# Patient Record
Sex: Male | Born: 1955 | Race: White | Hispanic: No | Marital: Married | Smoking: Never smoker
Health system: Southern US, Community
[De-identification: ages and names within clinical notes are randomized; demographics above are authoritative.]

## PROBLEM LIST (undated history)

## (undated) DIAGNOSIS — N419 Inflammatory disease of prostate, unspecified: Secondary | ICD-10-CM

## (undated) DIAGNOSIS — E785 Hyperlipidemia, unspecified: Secondary | ICD-10-CM

## (undated) DIAGNOSIS — I10 Essential (primary) hypertension: Secondary | ICD-10-CM

## (undated) DIAGNOSIS — K219 Gastro-esophageal reflux disease without esophagitis: Secondary | ICD-10-CM

## (undated) DIAGNOSIS — N400 Enlarged prostate without lower urinary tract symptoms: Secondary | ICD-10-CM

## (undated) HISTORY — PX: COLONOSCOPY: SHX174

## (undated) HISTORY — DX: Hyperlipidemia, unspecified: E78.5

## (undated) HISTORY — DX: Essential (primary) hypertension: I10

## (undated) HISTORY — DX: Gastro-esophageal reflux disease without esophagitis: K21.9

## (undated) HISTORY — DX: Benign prostatic hyperplasia without lower urinary tract symptoms: N40.0

## (undated) HISTORY — DX: Inflammatory disease of prostate, unspecified: N41.9

---

## 1996-07-11 HISTORY — PX: APPENDECTOMY: SHX54

## 1997-12-26 ENCOUNTER — Other Ambulatory Visit: Admission: RE | Admit: 1997-12-26 | Discharge: 1997-12-26 | Payer: Self-pay | Admitting: Podiatry

## 2001-03-09 ENCOUNTER — Emergency Department (HOSPITAL_COMMUNITY): Admission: EM | Admit: 2001-03-09 | Discharge: 2001-03-09 | Payer: Self-pay | Admitting: Emergency Medicine

## 2001-06-14 ENCOUNTER — Emergency Department (HOSPITAL_COMMUNITY): Admission: EM | Admit: 2001-06-14 | Discharge: 2001-06-14 | Payer: Self-pay

## 2001-06-14 ENCOUNTER — Encounter: Payer: Self-pay | Admitting: Emergency Medicine

## 2005-03-07 ENCOUNTER — Ambulatory Visit: Payer: Self-pay | Admitting: Internal Medicine

## 2005-05-10 ENCOUNTER — Ambulatory Visit: Payer: Self-pay | Admitting: Internal Medicine

## 2005-05-30 ENCOUNTER — Ambulatory Visit: Payer: Self-pay | Admitting: Internal Medicine

## 2005-06-10 ENCOUNTER — Ambulatory Visit: Payer: Self-pay | Admitting: Internal Medicine

## 2005-07-11 HISTORY — PX: ROTATOR CUFF REPAIR: SHX139

## 2005-09-30 ENCOUNTER — Ambulatory Visit (HOSPITAL_BASED_OUTPATIENT_CLINIC_OR_DEPARTMENT_OTHER): Admission: RE | Admit: 2005-09-30 | Discharge: 2005-09-30 | Payer: Self-pay | Admitting: Orthopedic Surgery

## 2007-05-08 ENCOUNTER — Encounter: Payer: Self-pay | Admitting: Internal Medicine

## 2007-07-03 ENCOUNTER — Ambulatory Visit: Payer: Self-pay | Admitting: Internal Medicine

## 2007-07-03 DIAGNOSIS — R002 Palpitations: Secondary | ICD-10-CM | POA: Insufficient documentation

## 2007-07-03 DIAGNOSIS — K219 Gastro-esophageal reflux disease without esophagitis: Secondary | ICD-10-CM | POA: Insufficient documentation

## 2007-07-03 DIAGNOSIS — R1011 Right upper quadrant pain: Secondary | ICD-10-CM | POA: Insufficient documentation

## 2007-07-03 LAB — CONVERTED CEMR LAB: Vit D, 1,25-Dihydroxy: 36 (ref 30–89)

## 2007-07-04 ENCOUNTER — Encounter: Payer: Self-pay | Admitting: Internal Medicine

## 2007-07-04 LAB — CONVERTED CEMR LAB
Basophils Absolute: 0 10*3/uL (ref 0.0–0.1)
Basophils Relative: 0.6 % (ref 0.0–1.0)
Eosinophils Absolute: 0.1 10*3/uL (ref 0.0–0.6)
Eosinophils Relative: 1.9 % (ref 0.0–5.0)
H Pylori IgG: POSITIVE — AB
HCT: 49.7 % (ref 39.0–52.0)
Hemoglobin: 17.3 g/dL — ABNORMAL HIGH (ref 13.0–17.0)
Lymphocytes Relative: 33.3 % (ref 12.0–46.0)
MCHC: 34.8 g/dL (ref 30.0–36.0)
MCV: 90.1 fL (ref 78.0–100.0)
Monocytes Absolute: 0.4 10*3/uL (ref 0.2–0.7)
Monocytes Relative: 7.1 % (ref 3.0–11.0)
Neutro Abs: 3.3 10*3/uL (ref 1.4–7.7)
Neutrophils Relative %: 57.1 % (ref 43.0–77.0)
Platelets: 208 10*3/uL (ref 150–400)
RBC: 5.51 M/uL (ref 4.22–5.81)
RDW: 12.3 % (ref 11.5–14.6)
TSH: 0.87 microintl units/mL (ref 0.35–5.50)
WBC: 5.7 10*3/uL (ref 4.5–10.5)

## 2007-07-06 ENCOUNTER — Encounter: Payer: Self-pay | Admitting: Internal Medicine

## 2007-07-06 ENCOUNTER — Encounter: Admission: RE | Admit: 2007-07-06 | Discharge: 2007-07-06 | Payer: Self-pay | Admitting: Internal Medicine

## 2007-07-10 ENCOUNTER — Ambulatory Visit: Payer: Self-pay | Admitting: Internal Medicine

## 2007-07-18 LAB — CONVERTED CEMR LAB
ALT: 19 units/L (ref 0–53)
Bilirubin, Direct: 0.3 mg/dL (ref 0.0–0.3)
CO2: 31 meq/L (ref 19–32)
Calcium: 9.3 mg/dL (ref 8.4–10.5)
Cholesterol: 139 mg/dL (ref 0–200)
GFR calc Af Amer: 92 mL/min
Glucose, Bld: 90 mg/dL (ref 70–99)
HDL: 33.1 mg/dL — ABNORMAL LOW (ref >39.0)
Hemoglobin, Urine: NEGATIVE
Ketones, ur: NEGATIVE mg/dL
LDL Cholesterol: 93 mg/dL (ref 0–99)
Potassium: 3.9 meq/L (ref 3.5–5.1)
Total Bilirubin: 1.5 mg/dL — ABNORMAL HIGH (ref 0.3–1.2)
Total CHOL/HDL Ratio: 4.2
Total Protein: 6.9 g/dL (ref 6.0–8.3)
Urine Glucose: NEGATIVE mg/dL

## 2007-07-27 ENCOUNTER — Encounter: Admission: RE | Admit: 2007-07-27 | Discharge: 2007-07-27 | Payer: Self-pay | Admitting: Orthopedic Surgery

## 2008-05-19 ENCOUNTER — Encounter: Payer: Self-pay | Admitting: Internal Medicine

## 2008-11-12 ENCOUNTER — Ambulatory Visit: Payer: Self-pay | Admitting: Internal Medicine

## 2008-11-12 LAB — CONVERTED CEMR LAB
Albumin: 4 g/dL (ref 3.5–5.2)
Basophils Absolute: 0.1 10*3/uL (ref 0.0–0.1)
Basophils Relative: 1.5 % (ref 0.0–3.0)
Chloride: 109 meq/L (ref 96–112)
Cholesterol: 132 mg/dL (ref 0–200)
Creatinine, Ser: 1.1 mg/dL (ref 0.4–1.5)
Eosinophils Absolute: 0.1 10*3/uL (ref 0.0–0.7)
GFR calc non Af Amer: 75.07 mL/min (ref >60)
HCT: 46.6 % (ref 39.0–52.0)
Hemoglobin, Urine: NEGATIVE
Hemoglobin: 16.4 g/dL (ref 13.0–17.0)
LDL Cholesterol: 91 mg/dL (ref 0–99)
Leukocytes, UA: NEGATIVE
Lymphocytes Relative: 33.5 % (ref 12.0–46.0)
Lymphs Abs: 1.5 10*3/uL (ref 0.7–4.0)
MCHC: 35.2 g/dL (ref 30.0–36.0)
MCV: 86.8 fL (ref 78.0–100.0)
Monocytes Absolute: 0.5 10*3/uL (ref 0.1–1.0)
Neutro Abs: 2.4 10*3/uL (ref 1.4–7.7)
PSA: 3.66 ng/mL (ref 0.10–4.00)
RDW: 12.8 % (ref 11.5–14.6)
Specific Gravity, Urine: 1.015 (ref 1.000–1.030)
TSH: 0.74 microintl units/mL (ref 0.35–5.50)
Total Protein: 7 g/dL (ref 6.0–8.3)
Triglycerides: 67 mg/dL (ref 0.0–149.0)
Urobilinogen, UA: 0.2 (ref 0.0–1.0)

## 2008-11-13 ENCOUNTER — Ambulatory Visit: Payer: Self-pay | Admitting: Internal Medicine

## 2008-11-13 DIAGNOSIS — R972 Elevated prostate specific antigen [PSA]: Secondary | ICD-10-CM | POA: Insufficient documentation

## 2008-11-24 ENCOUNTER — Encounter: Admission: RE | Admit: 2008-11-24 | Discharge: 2008-11-24 | Payer: Self-pay | Admitting: Orthopedic Surgery

## 2008-12-24 ENCOUNTER — Ambulatory Visit: Payer: Self-pay | Admitting: Gastroenterology

## 2009-01-14 ENCOUNTER — Ambulatory Visit: Payer: Self-pay | Admitting: Gastroenterology

## 2009-01-14 LAB — HM COLONOSCOPY

## 2009-11-19 ENCOUNTER — Ambulatory Visit: Payer: Self-pay | Admitting: Internal Medicine

## 2009-11-24 ENCOUNTER — Encounter: Payer: Self-pay | Admitting: Internal Medicine

## 2009-11-24 LAB — CONVERTED CEMR LAB
AST: 22 units/L (ref 0–37)
Alkaline Phosphatase: 69 units/L (ref 39–117)
Basophils Absolute: 0 10*3/uL (ref 0.0–0.1)
Bilirubin Urine: NEGATIVE
Bilirubin, Direct: 0.2 mg/dL (ref 0.0–0.3)
CO2: 30 meq/L (ref 19–32)
Calcium: 9.2 mg/dL (ref 8.4–10.5)
Creatinine, Ser: 1 mg/dL (ref 0.4–1.5)
Eosinophils Absolute: 0.1 10*3/uL (ref 0.0–0.7)
GFR calc non Af Amer: 83.47 mL/min (ref >60)
Glucose, Bld: 87 mg/dL (ref 70–99)
HDL: 37.8 mg/dL — ABNORMAL LOW (ref >39.00)
Hemoglobin, Urine: NEGATIVE
Hemoglobin: 16.8 g/dL (ref 13.0–17.0)
Ketones, ur: NEGATIVE mg/dL
LDL Cholesterol: 94 mg/dL (ref 0–99)
Lymphocytes Relative: 33.7 % (ref 12.0–46.0)
MCHC: 34.4 g/dL (ref 30.0–36.0)
Monocytes Relative: 9.4 % (ref 3.0–12.0)
Neutro Abs: 3.6 10*3/uL (ref 1.4–7.7)
Neutrophils Relative %: 54.6 % (ref 43.0–77.0)
PSA: 4.51 ng/mL — ABNORMAL HIGH (ref 0.10–4.00)
RBC: 5.48 M/uL (ref 4.22–5.81)
RDW: 13 % (ref 11.5–14.6)
Sodium: 143 meq/L (ref 135–145)
Total CHOL/HDL Ratio: 4
Triglycerides: 118 mg/dL (ref 0.0–149.0)
Urine Glucose: NEGATIVE mg/dL
Urobilinogen, UA: 0.2 (ref 0.0–1.0)
VLDL: 23.6 mg/dL (ref 0.0–40.0)

## 2009-11-25 ENCOUNTER — Ambulatory Visit: Payer: Self-pay | Admitting: Internal Medicine

## 2009-11-25 DIAGNOSIS — M25519 Pain in unspecified shoulder: Secondary | ICD-10-CM | POA: Insufficient documentation

## 2009-11-25 DIAGNOSIS — N410 Acute prostatitis: Secondary | ICD-10-CM | POA: Insufficient documentation

## 2010-02-24 ENCOUNTER — Ambulatory Visit: Payer: Self-pay | Admitting: Internal Medicine

## 2010-02-24 LAB — CONVERTED CEMR LAB
Hemoglobin, Urine: NEGATIVE
Nitrite: NEGATIVE
Specific Gravity, Urine: 1.025 (ref 1.000–1.030)
Total Protein, Urine: NEGATIVE mg/dL
pH: 6 (ref 5.0–8.0)

## 2010-03-01 ENCOUNTER — Telehealth: Payer: Self-pay | Admitting: Internal Medicine

## 2010-03-01 LAB — CONVERTED CEMR LAB
PSA, Free: 0.5 ng/mL
PSA: 3.11 ng/mL (ref 0.10–4.00)

## 2010-03-05 ENCOUNTER — Encounter: Payer: Self-pay | Admitting: Internal Medicine

## 2010-06-08 ENCOUNTER — Encounter: Payer: Self-pay | Admitting: Internal Medicine

## 2010-08-10 NOTE — Assessment & Plan Note (Signed)
Summary: cpx-lb   Vital Signs:  Patient profile:   55 year old male Height:      73 inches Weight:      258.13 pounds BMI:     34.18 O2 Sat:      95 % on Room air Temp:     98.2 degrees F oral Pulse rate:   71 / minute BP sitting:   160 / 110  (left arm) Cuff size:   large  Vitals Entered By: Lucious Groves (Nov 25, 2009 10:38 AM)  O2 Flow:  Room air CC: CPX./kb Is Patient Diabetic? No Pain Assessment Patient in pain? no        CC:  CPX./kb.  History of Present Illness: The patient presents for a wellness examination  C/o R shoulder pain - MRI is pending   Current Medications (verified): 1)  Vitamin D3 1000 Unit  Tabs (Cholecalciferol) .Marland Kitchen.. 1 Qd 2)  Protonix 40 Mg  Tbec (Pantoprazole Sodium) .... Once Daily 3)  Propranolol Hcl 10 Mg Tabs (Propranolol Hcl) .Marland Kitchen.. 1 By Mouth Two Times A Day As Needed Palpitation  Allergies (verified): No Known Drug Allergies  Past History:  Past Surgical History: Last updated: 07/03/2007 Rotator cuff repair R 2007  Family History: Last updated: 07/03/2007 No CAD F  brain aneurism  Social History: Last updated: 07/03/2007 Occupation: Married Never Smoked  Past Medical History: GERD Prostatitis  Family History: Reviewed history from 07/03/2007 and no changes required. No CAD F  brain aneurism  Social History: Reviewed history from 07/03/2007 and no changes required. Occupation: Married Never Smoked  Review of Systems  The patient denies anorexia, fever, weight loss, weight gain, vision loss, decreased hearing, hoarseness, chest pain, syncope, dyspnea on exertion, peripheral edema, prolonged cough, headaches, hemoptysis, abdominal pain, melena, hematochezia, severe indigestion/heartburn, hematuria, incontinence, genital sores, muscle weakness, suspicious skin lesions, transient blindness, difficulty walking, depression, unusual weight change, abnormal bleeding, enlarged lymph nodes, angioedema, and testicular masses.      Physical Exam  General:  Well-developed,well-nourished,in no acute distress; alert,appropriate and cooperative throughout examination Head:  Normocephalic and atraumatic without obvious abnormalities. No apparent alopecia or balding. Eyes:  No corneal or conjunctival inflammation noted. EOMI. Perrla Ears:  External ear exam shows no significant lesions or deformities.  Otoscopic examination reveals clear canals, tympanic membranes are intact bilaterally without bulging, retraction, inflammation or discharge. Hearing is grossly normal bilaterally. Nose:  External nasal examination shows no deformity or inflammation. Nasal mucosa are pink and moist without lesions or exudates. Mouth:  Oral mucosa and oropharynx without lesions or exudates.  Teeth in good repair. Neck:  No deformities, masses, or tenderness noted. Lungs:  Normal respiratory effort, chest expands symmetrically. Lungs are clear to auscultation, no crackles or wheezes. Heart:  Normal rate and regular rhythm. S1 and S2 normal without gallop, murmur, click, rub or other extra sounds. Abdomen:  Bowel sounds positive,abdomen soft and non-tender without masses, organomegaly or hernias noted. Rectal:  No external abnormalities noted. Normal sphincter tone. No rectal masses or tenderness. Genitalia:  Testes bilaterally descended without nodularity, tenderness or masses. No scrotal masses or lesions. No penis lesions or urethral discharge. Prostate:  NT, no gland enlargement and no nodules.   Msk:  No deformity or scoliosis noted of thoracic or lumbar spine.   Extremities:  No clubbing, cyanosis, edema, or deformity noted with normal full range of motion of all joints.   Neurologic:  No cranial nerve deficits noted. Station and gait are normal. Plantar reflexes are down-going bilaterally.  DTRs are symmetrical throughout. Sensory, motor and coordinative functions appear intact. Skin:  Intact without suspicious lesions or rashes Cervical  Nodes:  No lymphadenopathy noted Inguinal Nodes:  No significant adenopathy Psych:  Nl   Impression & Recommendations:  Problem # 1:  PHYSICAL EXAMINATION (ICD-V70.0) Assessment New Health and age related issues were discussed. Available screening tests and vaccinations were discussed as well. Healthy life style including good diet and execise was discussed. He had a colonoscopy. The labs were reviewed with the patient.  Orders: EKG w/ Interpretation (93000)  Problem # 2:  SHOULDER PAIN (ICD-719.41) R Assessment: Deteriorated Ortho f/u; MRI is pending  His updated medication list for this problem includes:    Tramadol Hcl 50 Mg Tabs (Tramadol hcl) .Marland Kitchen... 1-2 tabs by mouth two times a day as needed pain  Problem # 3:  PROSTATITIS, ACUTE (ICD-601.0) Assessment: New Cipro Recheck labs in 3 months   Problem # 4:  PROSTATE SPECIFIC ANTIGEN, ELEVATED (ICD-790.93) Assessment: Comment Only Recheck after treatment of #3; free PSA  Complete Medication List: 1)  Vitamin D3 1000 Unit Tabs (Cholecalciferol) .Marland Kitchen.. 1 qd 2)  Protonix 40 Mg Tbec (Pantoprazole sodium) .... Once daily 3)  Propranolol Hcl 10 Mg Tabs (Propranolol hcl) .Marland Kitchen.. 1 by mouth two times a day as needed palpitation 4)  Ciprofloxacin Hcl 500 Mg Tabs (Ciprofloxacin hcl) .Marland Kitchen.. 1 by mouth bid 5)  Tramadol Hcl 50 Mg Tabs (Tramadol hcl) .Marland Kitchen.. 1-2 tabs by mouth two times a day as needed pain  Other Orders: Tdap => 64yrs IM ([address]) Admin 1st Vaccine (60454) Admin 1st Vaccine (State) [phone])  Patient Instructions: 1)  In 3 months:  2)  PSA prior to visit, ICD-9: 599.0 3)  Free PSA 4)  Urine-dip prior to visit, ICD-9: Prescriptions: TRAMADOL HCL 50 MG TABS (TRAMADOL HCL) 1-2 tabs by mouth two times a day as needed pain  #120 x 3   Entered and Authorized by:   Tresa Garter MD   Signed by:   Tresa Garter MD on 11/25/2009   Method used:   Electronically to        [facility]  [phone]* (retail)       [address] [place], [place]  [address]       Ph: [phone] or [phone]       Fax: [phone]   RxID:   [id number] CIPROFLOXACIN HCL 500 MG TABS (CIPROFLOXACIN HCL) 1 by mouth bid  #28 x 1   Entered and Authorized by:   Tresa Garter MD   Signed by:   Tresa Garter MD on 11/25/2009   Method used:   Electronically to        [facility]  [phone]* (retail)       [address] [place], [place]  [address]       Ph: [phone] or [phone]       Fax: [phone]   RxID:   [id number]    Tetanus/Td Vaccine    Vaccine Type: Tdap    Site: left deltoid    Mfr: GlaxoSmithKline    Dose: 0.5 ml    Route: IM    Given by: Lucious Groves    Exp. Date: 10/03/2011    Lot #: [id number]    VIS given: 05/29/07 version given Nov 25, 2009.

## 2010-08-10 NOTE — Progress Notes (Signed)
Summary: Urology Referral  Phone Note Call from Patient   Summary of Call: pt informed of free PSA results. He is fine w. Urology consult.  He prefers Tue-Thur anytime.  Please enter referral info, Initial call taken by: Lanier Prude, [facility]),  March 01, 2010 4:24 PM  Follow-up for Phone Call        ok Follow-up by: Tresa Garter MD,  March 01, 2010 5:36 PM  Additional Follow-up for Phone Call Additional follow up Details #1::        Closed phone note. Additional Follow-up by: Lucious Groves CMA,  March 02, 2010 8:59 AM

## 2010-08-10 NOTE — Letter (Signed)
Summary: [facility]  [facility]   Imported By: Lennie Odor 06/15/2010 11:25:07  _____________________________________________________________________  External Attachment:    Type:   Image     Comment:   External Document

## 2010-08-10 NOTE — Letter (Signed)
Summary: Generic Letter  [facility]  [address] [place], [place] [address]   Phone: [phone]  Fax: [phone]    11/24/2009  Randy Knapp [address] [place], [place]  [address]  Dear [name],  Our office has not been successful in reaching you in regards to your test results. Please contact our office to correct your contact information and discuss lab results.       Sincerely,   Jacinta Shoe, MD

## 2010-08-13 NOTE — Letter (Signed)
Summary: [facility]   Imported By: Sherian Rein 03/16/2010 07:54:37  _____________________________________________________________________  External Attachment:    Type:   Image     Comment:   External Document

## 2010-11-26 NOTE — Op Note (Signed)
NAMEENMANUEL, ZUFALL NO.:  [id number]   MEDICAL RECORD NO.:  [id number]          PATIENT TYPE:  AMB   LOCATION:  [facility]                          FACILITY:  [facility]   PHYSICIAN:  Harvie Junior, M.D.   DATE OF BIRTH:  [birth year]   DATE OF PROCEDURE:  09/30/2005  DATE OF DISCHARGE:  09/30/2005                                 OPERATIVE REPORT   SURGEON:  Harvie Junior, M.D.   ASSISTANT:  Marshia Ly, P.A.   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   POSTOPERATIVE DIAGNOSES:  Impingement, acromioclavicular joint arthritis,  with a questionable rotator cuff tear, chondral injury to the humeral head.   PRINCIPLE PROCEDURES:  1. Anterolateral acromioplasty from the lateral and posterior compartment.  2. Distal clavicle resection through an anterior compartment.  3. Debridement of high-grade partial-thickness rotator cuff tear from the      undersurface, but within the glenohumeral joint.  4. Debridement of chondral injury to the humeral head.   NOTE:  The date of the procedure was 09/30/2005, the date of repeat lost  dictation is going to be 02/17/2006.  The original dictation date would have  been 09/30/2005.   ANESTHESIA:  General.   BRIEF HISTORY:  Mr. 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  He is a gentleman, who has had a long history of having  significant shoulder pain.  We treated him with anti-inflammatory  medication, activity modification, stretching and strengthening program,  physical therapy, injection therapy; all of this failed.  And because of  continuing complaints of left shoulder pain, the patient was ultimately  taken to the operating room for operative shoulder arthroscopy.   PROCEDURE:  Patient was taken to the operating room, where after adequate  anesthesia was obtained with general anesthesia, the patient was placed  supine on the  operating table.  The left shoulder was then prepped and  draped in the usual sterile fashion.  Following this, routine arthroscopic  examination of the shoulder revealed that there was an obvious significant  undersurface rotator cuff tear.  This was debrided from within the  glenohumeral joint.  The patient was also shown to have some grade-2 and  grade-3 changes on the humeral head, and these were debrided from within the  glenohumeral joint with the suction shaver back to a smooth and stable rim.  A check was made for any loose and fragmented pieces.  Seeing none, the  glenohumeral joint was copiously irrigated and suctioned dried.  The cannula  was then moved out of the glenohumeral joint into the subacromial space.  The area of the rotator cuff had been marked previously on the undersurface  so that it could be evaluated on the superior surface.  Once this was  accomplished, attention was turned to the subacromial space, and the area of  the rotator cuff was identified.  It did not appear to be full thickness at  this point; in fact, it felt to have a very, somewhat normal feel to the  rotator cuff in  this area.  At this point, a subtotal bursectomy was  performed in the subacromial space, and then an anterolateral acromioplasty  from the lateral and posterior compartment was performed.  Once this was  completed, attention was turned to the distal clavicle, where distal  clavicle resection of 17 mm was performed through an anterior portal.  At  this point, the rotator cuff was again evaluated thoroughly and probed.  The  suction shaver was used to make sure there was no significant continued  bursal inflammation, and once this was accomplished, the patient had his  shoulder copiously irrigated and suctioned dry.  The arthroscopic portals  were closed with a bandage.  A sterile compressive dressing was applied.  The patient was taken to the recovery room, where she was noted to be in   satisfactory condition.  The estimated blood loss for the procedure was  negligible.      Harvie Junior, M.D.  Electronically Signed     JLG/MEDQ  D:  02/17/2006  T:  02/17/2006  Job:  [id number]

## 2011-01-18 ENCOUNTER — Other Ambulatory Visit (INDEPENDENT_AMBULATORY_CARE_PROVIDER_SITE_OTHER): Payer: Self-pay

## 2011-01-18 ENCOUNTER — Encounter: Payer: Self-pay | Admitting: Internal Medicine

## 2011-01-18 ENCOUNTER — Other Ambulatory Visit: Payer: Self-pay | Admitting: Internal Medicine

## 2011-01-18 DIAGNOSIS — Z0000 Encounter for general adult medical examination without abnormal findings: Secondary | ICD-10-CM

## 2011-01-18 DIAGNOSIS — Z0389 Encounter for observation for other suspected diseases and conditions ruled out: Secondary | ICD-10-CM

## 2011-01-18 LAB — LIPID PANEL
HDL: 38.6 mg/dL — ABNORMAL LOW (ref >39.00)
LDL Cholesterol: 94 mg/dL (ref 0–99)
Total CHOL/HDL Ratio: 4
Triglycerides: 98 mg/dL (ref 0.0–149.0)
VLDL: 19.6 mg/dL (ref 0.0–40.0)

## 2011-01-18 LAB — HEPATIC FUNCTION PANEL
Albumin: 4.2 g/dL (ref 3.5–5.2)
Alkaline Phosphatase: 69 U/L (ref 39–117)
Total Protein: 6.8 g/dL (ref 6.0–8.3)

## 2011-01-18 LAB — CBC WITH DIFFERENTIAL/PLATELET
Lymphocytes Relative: 39.4 % (ref 12.0–46.0)
MCHC: 34.1 g/dL (ref 30.0–36.0)
Monocytes Absolute: 0.5 10*3/uL (ref 0.1–1.0)
Neutrophils Relative %: 48 % (ref 43.0–77.0)
RBC: 5.46 Mil/uL (ref 4.22–5.81)
RDW: 14.1 % (ref 11.5–14.6)

## 2011-01-18 LAB — BASIC METABOLIC PANEL
CO2: 28 mEq/L (ref 19–32)
Calcium: 9.2 mg/dL (ref 8.4–10.5)
Chloride: 110 mEq/L (ref 96–112)
Potassium: 4.1 mEq/L (ref 3.5–5.1)
Sodium: 144 mEq/L (ref 135–145)

## 2011-01-18 LAB — URINALYSIS
Bilirubin Urine: NEGATIVE
Ketones, ur: NEGATIVE
Leukocytes, UA: NEGATIVE
pH: 6 (ref 5.0–8.0)

## 2011-01-20 ENCOUNTER — Encounter: Payer: Self-pay | Admitting: Internal Medicine

## 2011-01-25 ENCOUNTER — Encounter: Payer: Self-pay | Admitting: Internal Medicine

## 2011-01-25 ENCOUNTER — Ambulatory Visit (INDEPENDENT_AMBULATORY_CARE_PROVIDER_SITE_OTHER): Payer: BC Managed Care – PPO | Admitting: Internal Medicine

## 2011-01-25 VITALS — BP 130/88 | HR 76 | Temp 98.4°F | Resp 16 | Ht 73.0 in | Wt 269.0 lb

## 2011-01-25 DIAGNOSIS — Z0000 Encounter for general adult medical examination without abnormal findings: Secondary | ICD-10-CM

## 2011-01-25 MED ORDER — NAPROXEN-ESOMEPRAZOLE 500-20 MG PO TBEC: 1.0000 | DELAYED_RELEASE_TABLET | Freq: Two times a day (BID) | ORAL | Status: DC

## 2011-01-25 MED ORDER — KETOCONAZOLE 2 % EX CREA: TOPICAL_CREAM | Freq: Two times a day (BID) | CUTANEOUS | Status: AC

## 2011-01-25 NOTE — Progress Notes (Signed)
  Subjective:    Patient ID: Randy Knapp. Randy Knapp, male    DOB: 1956-03-01, 55 y.o.   MRN: [id number]  HPI  The patient is here for a wellness exam. The patient has been doing well overall without major physical or psychological issues going on lately.  Review of Systems  Constitutional: Negative for appetite change, fatigue and unexpected weight change.  HENT: Negative for nosebleeds, congestion, sore throat, sneezing, trouble swallowing and neck pain.   Eyes: Negative for itching and visual disturbance.  Respiratory: Negative for cough.   Cardiovascular: Negative for chest pain, palpitations and leg swelling.  Gastrointestinal: Negative for nausea, diarrhea, blood in stool and abdominal distention.  Genitourinary: Negative for frequency and hematuria.  Musculoskeletal: Negative for back pain, joint swelling and gait problem.  Skin: Negative for rash.  Neurological: Negative for dizziness, tremors, speech difficulty and weakness.  Psychiatric/Behavioral: Negative for sleep disturbance, dysphoric mood and agitation. The patient is not nervous/anxious.        Objective:   Physical Exam  Constitutional: He is oriented to person, place, and time. He appears well-developed.  HENT:  Mouth/Throat: Oropharynx is clear and moist.  Eyes: Conjunctivae are normal. Pupils are equal, round, and reactive to light.  Neck: Normal range of motion. No JVD present. No thyromegaly present.  Cardiovascular: Normal rate, regular rhythm, normal heart sounds and intact distal pulses.  Exam reveals no gallop and no friction rub.   No murmur heard. Pulmonary/Chest: Effort normal and breath sounds normal. No respiratory distress. He has no wheezes. He has no rales. He exhibits no tenderness.  Abdominal: Soft. Bowel sounds are normal. He exhibits no distension and no mass. There is no tenderness. There is no rebound and no guarding.  Musculoskeletal: Normal range of motion. He exhibits no edema and no tenderness.    Lymphadenopathy:    He has no cervical adenopathy.  Neurological: He is alert and oriented to person, place, and time. He has normal reflexes. No cranial nerve deficit. He exhibits normal muscle tone. Coordination normal.  Skin: Skin is warm and dry. Rash (axillas) noted.       Moles  Psychiatric: He has a normal mood and affect. His behavior is normal. Judgment and thought content normal.  rash under arms Moles      Lab Results  Component Value Date   WBC 5.5 01/18/2011   HGB 15.7 01/18/2011   HCT 46.2 01/18/2011   PLT 182.0 01/18/2011   CHOL 152 01/18/2011   TRIG 98.0 01/18/2011   HDL 38.60* 01/18/2011   ALT 23 01/18/2011   AST 21 01/18/2011   NA 144 01/18/2011   K 4.1 01/18/2011   CL 110 01/18/2011   CREATININE 1.1 01/18/2011   BUN 17 01/18/2011   CO2 28 01/18/2011   TSH 1.20 01/18/2011   PSA 4.25* 01/18/2011      Assessment & Plan:

## 2012-03-27 ENCOUNTER — Other Ambulatory Visit: Payer: Self-pay | Admitting: *Deleted

## 2012-03-27 ENCOUNTER — Other Ambulatory Visit (INDEPENDENT_AMBULATORY_CARE_PROVIDER_SITE_OTHER): Payer: BC Managed Care – PPO

## 2012-03-27 DIAGNOSIS — Z0389 Encounter for observation for other suspected diseases and conditions ruled out: Secondary | ICD-10-CM

## 2012-03-27 DIAGNOSIS — Z0000 Encounter for general adult medical examination without abnormal findings: Secondary | ICD-10-CM

## 2012-03-27 LAB — CBC WITH DIFFERENTIAL/PLATELET
Basophils Relative: 0.8 % (ref 0.0–3.0)
Eosinophils Absolute: 0.1 10*3/uL (ref 0.0–0.7)
MCHC: 32.6 g/dL (ref 30.0–36.0)
MCV: 82.8 fl (ref 78.0–100.0)
Monocytes Absolute: 0.5 10*3/uL (ref 0.1–1.0)
Neutrophils Relative %: 56.5 % (ref 43.0–77.0)
Platelets: 187 10*3/uL (ref 150.0–400.0)

## 2012-03-27 LAB — BASIC METABOLIC PANEL
CO2: 27 mEq/L (ref 19–32)
Chloride: 107 mEq/L (ref 96–112)
Creatinine, Ser: 1.2 mg/dL (ref 0.4–1.5)

## 2012-03-27 LAB — LIPID PANEL
Cholesterol: 169 mg/dL (ref 0–200)
Total CHOL/HDL Ratio: 5
Triglycerides: 140 mg/dL (ref 0.0–149.0)

## 2012-03-27 LAB — HEPATIC FUNCTION PANEL
ALT: 23 U/L (ref 0–53)
AST: 23 U/L (ref 0–37)
Bilirubin, Direct: 0.2 mg/dL (ref 0.0–0.3)
Total Protein: 6.8 g/dL (ref 6.0–8.3)

## 2012-03-27 LAB — URINALYSIS, ROUTINE W REFLEX MICROSCOPIC
Bilirubin Urine: NEGATIVE
Hgb urine dipstick: NEGATIVE
Ketones, ur: NEGATIVE
Urine Glucose: NEGATIVE
Urobilinogen, UA: 0.2 (ref 0.0–1.0)

## 2012-04-04 ENCOUNTER — Encounter: Payer: Self-pay | Admitting: Internal Medicine

## 2012-04-04 ENCOUNTER — Ambulatory Visit (INDEPENDENT_AMBULATORY_CARE_PROVIDER_SITE_OTHER): Payer: BC Managed Care – PPO | Admitting: Internal Medicine

## 2012-04-04 VITALS — BP 122/82 | HR 80 | Temp 98.9°F | Resp 16 | Wt 274.0 lb

## 2012-04-04 DIAGNOSIS — K219 Gastro-esophageal reflux disease without esophagitis: Secondary | ICD-10-CM

## 2012-04-04 DIAGNOSIS — M25519 Pain in unspecified shoulder: Secondary | ICD-10-CM

## 2012-04-04 DIAGNOSIS — Z0000 Encounter for general adult medical examination without abnormal findings: Secondary | ICD-10-CM

## 2012-04-04 DIAGNOSIS — Z23 Encounter for immunization: Secondary | ICD-10-CM

## 2012-04-04 DIAGNOSIS — R972 Elevated prostate specific antigen [PSA]: Secondary | ICD-10-CM

## 2012-04-04 MED ORDER — TRAMADOL HCL 50 MG PO TABS: 50.0000 mg | ORAL_TABLET | Freq: Two times a day (BID) | ORAL | Status: DC | PRN

## 2012-04-04 NOTE — Assessment & Plan Note (Signed)
Prn Rx 

## 2012-04-04 NOTE — Progress Notes (Signed)
   Subjective:    Patient ID: Randy Knapp. Randy Knapp, male    DOB: [birth year], 56 y.o.   MRN: [id number]  HPI  The patient is here for a wellness exam. The patient has been doing well overall without major physical or psychological issues going on lately.  BP Readings from Last 3 Encounters:  04/04/12 122/82  01/25/11 130/88  11/25/09 160/110   Wt Readings from Last 3 Encounters:  04/04/12 274 lb (124.286 kg)  01/25/11 269 lb (122.018 kg)  11/25/09 258 lb 2.1 oz (117.088 kg)      Review of Systems  Constitutional: Negative for appetite change, fatigue and unexpected weight change.  HENT: Negative for nosebleeds, congestion, sore throat, sneezing, trouble swallowing and neck pain.   Eyes: Negative for itching and visual disturbance.  Respiratory: Negative for cough.   Cardiovascular: Negative for chest pain, palpitations and leg swelling.  Gastrointestinal: Negative for nausea, diarrhea, blood in stool and abdominal distention.  Genitourinary: Negative for frequency and hematuria.  Musculoskeletal: Negative for back pain, joint swelling and gait problem.  Skin: Negative for rash.  Neurological: Negative for dizziness, tremors, speech difficulty and weakness.  Psychiatric/Behavioral: Negative for disturbed wake/sleep cycle, dysphoric mood and agitation. The patient is not nervous/anxious.        Objective:   Physical Exam  Constitutional: He is oriented to person, place, and time. He appears well-developed.  HENT:  Mouth/Throat: Oropharynx is clear and moist.  Eyes: Conjunctivae normal are normal. Pupils are equal, round, and reactive to light.  Neck: Normal range of motion. No JVD present. No thyromegaly present.  Cardiovascular: Normal rate, regular rhythm, normal heart sounds and intact distal pulses.  Exam reveals no gallop and no friction rub.   No murmur heard. Pulmonary/Chest: Effort normal and breath sounds normal. No respiratory distress. He has no wheezes. He has no  rales. He exhibits no tenderness.  Abdominal: Soft. Bowel sounds are normal. He exhibits no distension and no mass. There is no tenderness. There is no rebound and no guarding.  Musculoskeletal: Normal range of motion. He exhibits no edema and no tenderness.  Lymphadenopathy:    He has no cervical adenopathy.  Neurological: He is alert and oriented to person, place, and time. He has normal reflexes. No cranial nerve deficit. He exhibits normal muscle tone. Coordination normal.  Skin: Skin is warm and dry. Rash (axillas) noted.       Moles  Psychiatric: He has a normal mood and affect. His behavior is normal. Judgment and thought content normal.   L shoulder hurts w/ROM Moles      Lab Results  Component Value Date   WBC 6.1 03/27/2012   HGB 16.0 03/27/2012   HCT 49.2 03/27/2012   PLT 187.0 03/27/2012   CHOL 169 03/27/2012   TRIG 140.0 03/27/2012   HDL 34.20* 03/27/2012   ALT 23 03/27/2012   AST 23 03/27/2012   NA 140 03/27/2012   K 4.1 03/27/2012   CL 107 03/27/2012   CREATININE 1.2 03/27/2012   BUN 14 03/27/2012   CO2 27 03/27/2012   TSH 1.06 03/27/2012   PSA 4.86* 03/27/2012      Assessment & Plan:

## 2012-04-04 NOTE — Assessment & Plan Note (Signed)
L rotator cuff tear 2012

## 2012-04-04 NOTE — Assessment & Plan Note (Signed)
2012 S/p bx x 2

## 2012-04-04 NOTE — Assessment & Plan Note (Signed)
We discussed age appropriate health related issues, including available/recomended screening tests and vaccinations. We discussed a need for adhering to healthy diet and exercise. Labs/EKG were reviewed/ordered. All questions were answered.   

## 2012-04-08 ENCOUNTER — Encounter: Payer: Self-pay | Admitting: Internal Medicine

## 2012-12-10 ENCOUNTER — Telehealth: Payer: Self-pay | Admitting: Internal Medicine

## 2012-12-10 DIAGNOSIS — Z0000 Encounter for general adult medical examination without abnormal findings: Secondary | ICD-10-CM

## 2012-12-10 DIAGNOSIS — Z0389 Encounter for observation for other suspected diseases and conditions ruled out: Secondary | ICD-10-CM

## 2012-12-10 NOTE — Telephone Encounter (Signed)
Please give patient a call.  He would like to do labs before his physical in July.

## 2012-12-11 NOTE — Telephone Encounter (Signed)
CPE labs entered. Pt informed

## 2013-01-04 ENCOUNTER — Other Ambulatory Visit (INDEPENDENT_AMBULATORY_CARE_PROVIDER_SITE_OTHER): Payer: BC Managed Care – PPO

## 2013-01-04 DIAGNOSIS — Z0000 Encounter for general adult medical examination without abnormal findings: Secondary | ICD-10-CM

## 2013-01-04 DIAGNOSIS — Z0389 Encounter for observation for other suspected diseases and conditions ruled out: Secondary | ICD-10-CM

## 2013-01-04 LAB — URINALYSIS, ROUTINE W REFLEX MICROSCOPIC
Leukocytes, UA: NEGATIVE
RBC / HPF: NONE SEEN (ref 0–?)
Specific Gravity, Urine: >=1.03 (ref 1.000–1.030)
Urobilinogen, UA: 0.2 (ref 0.0–1.0)

## 2013-01-04 LAB — CBC WITH DIFFERENTIAL/PLATELET
Basophils Absolute: 0 10*3/uL (ref 0.0–0.1)
Eosinophils Absolute: 0.2 10*3/uL (ref 0.0–0.7)
HCT: 46.6 % (ref 39.0–52.0)
Hemoglobin: 15.6 g/dL (ref 13.0–17.0)
Lymphs Abs: 2.1 10*3/uL (ref 0.7–4.0)
MCHC: 33.3 g/dL (ref 30.0–36.0)
Neutro Abs: 3.3 10*3/uL (ref 1.4–7.7)
RDW: 13.4 % (ref 11.5–14.6)

## 2013-01-04 LAB — HEPATIC FUNCTION PANEL
Albumin: 4.2 g/dL (ref 3.5–5.2)
Alkaline Phosphatase: 87 U/L (ref 39–117)

## 2013-01-04 LAB — PSA: PSA: 4.73 ng/mL — ABNORMAL HIGH (ref 0.10–4.00)

## 2013-01-04 LAB — BASIC METABOLIC PANEL
CO2: 30 mEq/L (ref 19–32)
Chloride: 106 mEq/L (ref 96–112)
Glucose, Bld: 99 mg/dL (ref 70–99)
Potassium: 4.4 mEq/L (ref 3.5–5.1)
Sodium: 141 mEq/L (ref 135–145)

## 2013-01-09 ENCOUNTER — Ambulatory Visit (INDEPENDENT_AMBULATORY_CARE_PROVIDER_SITE_OTHER): Payer: BC Managed Care – PPO | Admitting: Internal Medicine

## 2013-01-09 ENCOUNTER — Encounter: Payer: Self-pay | Admitting: Internal Medicine

## 2013-01-09 VITALS — BP 130/80 | HR 76 | Temp 97.3°F | Resp 16 | Ht 73.0 in | Wt 267.0 lb

## 2013-01-09 DIAGNOSIS — R972 Elevated prostate specific antigen [PSA]: Secondary | ICD-10-CM

## 2013-01-09 DIAGNOSIS — K219 Gastro-esophageal reflux disease without esophagitis: Secondary | ICD-10-CM

## 2013-01-09 DIAGNOSIS — R079 Chest pain, unspecified: Secondary | ICD-10-CM

## 2013-01-09 DIAGNOSIS — Z0000 Encounter for general adult medical examination without abnormal findings: Secondary | ICD-10-CM

## 2013-01-09 NOTE — Assessment & Plan Note (Signed)
We discussed age appropriate health related issues, including available/recomended screening tests and vaccinations. We discussed a need for adhering to healthy diet and exercise. Labs/EKG were reviewed/ordered. All questions were answered.   

## 2013-01-09 NOTE — Assessment & Plan Note (Signed)
Urol appt today

## 2013-01-09 NOTE — Assessment & Plan Note (Signed)
Continue with current prescription therapy as reflected on the Med list.  

## 2013-01-09 NOTE — Progress Notes (Signed)
   Subjective:     HPI  The patient is here for a wellness exam. The patient has been doing well overall without major physical or psychological issues going on lately.  C/o CP w/exertion  BP Readings from Last 3 Encounters:  01/09/13 140/98  04/04/12 122/82  01/25/11 130/88   Wt Readings from Last 3 Encounters:  01/09/13 267 lb (121.11 kg)  04/04/12 274 lb (124.286 kg)  01/25/11 269 lb (122.018 kg)      Review of Systems  Constitutional: Negative for appetite change, fatigue and unexpected weight change.  HENT: Negative for nosebleeds, congestion, sore throat, sneezing, trouble swallowing and neck pain.   Eyes: Negative for itching and visual disturbance.  Respiratory: Negative for cough.   Cardiovascular: Negative for chest pain, palpitations and leg swelling.  Gastrointestinal: Negative for nausea, diarrhea, blood in stool and abdominal distention.  Genitourinary: Negative for frequency and hematuria.  Musculoskeletal: Negative for back pain, joint swelling and gait problem.  Skin: Negative for rash.  Neurological: Negative for dizziness, tremors, speech difficulty and weakness.  Psychiatric/Behavioral: Negative for sleep disturbance, dysphoric mood and agitation. The patient is not nervous/anxious.        Objective:   Physical Exam  Constitutional: He is oriented to person, place, and time. He appears well-developed.  HENT:  Mouth/Throat: Oropharynx is clear and moist.  Eyes: Conjunctivae are normal. Pupils are equal, round, and reactive to light.  Neck: Normal range of motion. No JVD present. No thyromegaly present.  Cardiovascular: Normal rate, regular rhythm, normal heart sounds and intact distal pulses.  Exam reveals no gallop and no friction rub.   No murmur heard. Pulmonary/Chest: Effort normal and breath sounds normal. No respiratory distress. He has no wheezes. He has no rales. He exhibits no tenderness.  Abdominal: Soft. Bowel sounds are normal. He  exhibits no distension and no mass. There is no tenderness. There is no rebound and no guarding.  Musculoskeletal: Normal range of motion. He exhibits no edema and no tenderness.  Lymphadenopathy:    He has no cervical adenopathy.  Neurological: He is alert and oriented to person, place, and time. He has normal reflexes. No cranial nerve deficit. He exhibits normal muscle tone. Coordination normal.  Skin: Skin is warm and dry. Rash (axillas) noted.  Moles  Psychiatric: He has a normal mood and affect. His behavior is normal. Judgment and thought content normal.   L shoulder hurts w/ROM Moles      Lab Results  Component Value Date   WBC 6.1 01/04/2013   HGB 15.6 01/04/2013   HCT 46.6 01/04/2013   PLT 204.0 01/04/2013   CHOL 158 01/04/2013   TRIG 135.0 01/04/2013   HDL 36.50* 01/04/2013   ALT 17 01/04/2013   AST 20 01/04/2013   NA 141 01/04/2013   K 4.4 01/04/2013   CL 106 01/04/2013   CREATININE 1.2 01/04/2013   BUN 15 01/04/2013   CO2 30 01/04/2013   TSH 1.50 01/04/2013   PSA 4.73* 01/04/2013      Assessment & Plan:

## 2013-01-18 ENCOUNTER — Ambulatory Visit (HOSPITAL_COMMUNITY): Payer: BC Managed Care – PPO | Attending: Internal Medicine | Admitting: Radiology

## 2013-01-18 ENCOUNTER — Encounter: Payer: Self-pay | Admitting: Internal Medicine

## 2013-01-18 ENCOUNTER — Ambulatory Visit (HOSPITAL_BASED_OUTPATIENT_CLINIC_OR_DEPARTMENT_OTHER): Payer: BC Managed Care – PPO

## 2013-01-18 DIAGNOSIS — R0989 Other specified symptoms and signs involving the circulatory and respiratory systems: Secondary | ICD-10-CM

## 2013-01-18 DIAGNOSIS — R079 Chest pain, unspecified: Secondary | ICD-10-CM | POA: Insufficient documentation

## 2013-01-18 DIAGNOSIS — R072 Precordial pain: Secondary | ICD-10-CM

## 2013-01-18 NOTE — Progress Notes (Signed)
Stress Echocardiogram performed.  

## 2013-04-09 ENCOUNTER — Ambulatory Visit (INDEPENDENT_AMBULATORY_CARE_PROVIDER_SITE_OTHER): Payer: BC Managed Care – PPO | Admitting: Internal Medicine

## 2013-04-09 ENCOUNTER — Encounter: Payer: Self-pay | Admitting: Internal Medicine

## 2013-04-09 VITALS — BP 112/72 | HR 66 | Temp 97.7°F | Wt 267.0 lb

## 2013-04-09 DIAGNOSIS — IMO0001 Reserved for inherently not codable concepts without codable children: Secondary | ICD-10-CM

## 2013-04-09 DIAGNOSIS — R030 Elevated blood-pressure reading, without diagnosis of hypertension: Secondary | ICD-10-CM

## 2013-04-09 MED ORDER — LOSARTAN POTASSIUM 100 MG PO TABS: 100.0000 mg | ORAL_TABLET | Freq: Every day | ORAL | Status: DC

## 2013-04-09 NOTE — Patient Instructions (Signed)
No added salt diet [email or web address]

## 2013-04-09 NOTE — Progress Notes (Signed)
   Subjective:     HPI  C/o BP 220/100 a few days ago. It got better after 2 days.   BP Readings from Last 3 Encounters:  04/09/13 112/72  01/09/13 130/80  04/04/12 122/82   Wt Readings from Last 3 Encounters:  04/09/13 267 lb (121.11 kg)  01/09/13 267 lb (121.11 kg)  04/04/12 274 lb (124.286 kg)      Review of Systems  Constitutional: Negative for appetite change, fatigue and unexpected weight change.  HENT: Negative for nosebleeds, congestion, sore throat, sneezing, trouble swallowing and neck pain.   Eyes: Negative for itching and visual disturbance.  Respiratory: Negative for cough.   Cardiovascular: Negative for chest pain, palpitations and leg swelling.  Gastrointestinal: Negative for nausea, diarrhea, blood in stool and abdominal distention.  Genitourinary: Negative for frequency and hematuria.  Musculoskeletal: Negative for back pain, joint swelling and gait problem.  Skin: Negative for rash.  Neurological: Negative for dizziness, tremors, speech difficulty and weakness.  Psychiatric/Behavioral: Negative for sleep disturbance, dysphoric mood and agitation. The patient is not nervous/anxious.        Objective:   Physical Exam  Constitutional: He is oriented to person, place, and time. He appears well-developed.  HENT:  Mouth/Throat: Oropharynx is clear and moist.  Eyes: Conjunctivae are normal. Pupils are equal, round, and reactive to light.  Neck: Normal range of motion. No JVD present. No thyromegaly present.  Cardiovascular: Normal rate, regular rhythm, normal heart sounds and intact distal pulses.  Exam reveals no gallop and no friction rub.   No murmur heard. Pulmonary/Chest: Effort normal and breath sounds normal. No respiratory distress. He has no wheezes. He has no rales. He exhibits no tenderness.  Abdominal: Soft. Bowel sounds are normal. He exhibits no distension and no mass. There is no tenderness. There is no rebound and no guarding.   Musculoskeletal: Normal range of motion. He exhibits no edema and no tenderness.  Lymphadenopathy:    He has no cervical adenopathy.  Neurological: He is alert and oriented to person, place, and time. He has normal reflexes. No cranial nerve deficit. He exhibits normal muscle tone. Coordination normal.  Skin: Skin is warm and dry. Rash (axillas) noted.  Moles  Psychiatric: He has a normal mood and affect. His behavior is normal. Judgment and thought content normal.    Moles      Lab Results  Component Value Date   WBC 6.1 01/04/2013   HGB 15.6 01/04/2013   HCT 46.6 01/04/2013   PLT 204.0 01/04/2013   CHOL 158 01/04/2013   TRIG 135.0 01/04/2013   HDL 36.50* 01/04/2013   ALT 17 01/04/2013   AST 20 01/04/2013   NA 141 01/04/2013   K 4.4 01/04/2013   CL 106 01/04/2013   CREATININE 1.2 01/04/2013   BUN 15 01/04/2013   CO2 30 01/04/2013   TSH 1.50 01/04/2013   PSA 4.73* 01/04/2013      Assessment & Plan:

## 2013-04-09 NOTE — Assessment & Plan Note (Signed)
9/14 one episode that followed consumption of salty fish NAS diet Losartan 100 mg po qd to start if needed  BP Readings from Last 3 Encounters:  04/09/13 112/72  01/09/13 130/80  04/04/12 122/82

## 2015-02-13 ENCOUNTER — Encounter: Payer: Self-pay | Admitting: Gastroenterology

## 2015-07-21 ENCOUNTER — Encounter: Payer: Self-pay | Admitting: Internal Medicine

## 2015-12-04 ENCOUNTER — Encounter: Payer: Self-pay | Admitting: Family

## 2015-12-04 ENCOUNTER — Ambulatory Visit (INDEPENDENT_AMBULATORY_CARE_PROVIDER_SITE_OTHER): Payer: 59 | Admitting: Family

## 2015-12-04 VITALS — BP 122/80 | HR 62 | Temp 98.8°F | Resp 18 | Ht 73.0 in | Wt 248.0 lb

## 2015-12-04 DIAGNOSIS — J069 Acute upper respiratory infection, unspecified: Secondary | ICD-10-CM

## 2015-12-04 MED ORDER — AMOXICILLIN 500 MG PO CAPS: 500.0000 mg | ORAL_CAPSULE | Freq: Two times a day (BID) | ORAL | Status: DC

## 2015-12-04 NOTE — Patient Instructions (Signed)
Thank you for choosing [facility].  Summary/Instructions:  This appears to be a viral infection. I have given your a written prescription for an antibiotic I recommend that you wait for 3-4 days to determine if this will improve without antibiotics.  Your prescription(s) have been submitted to your pharmacy or been printed and provided for you. Please take as directed and contact our office if you believe you are having problem(s) with the medication(s) or have any questions.  If your symptoms worsen or fail to improve, please contact our office for further instruction, or in case of emergency go directly to the emergency room at the closest medical facility.   General Recommendations:    Please drink plenty of fluids.  Get plenty of rest   Sleep in humidified air  Use saline nasal sprays  Netti pot   OTC Medications:  Decongestants - helps relieve congestion   Flonase (generic fluticasone) or Nasacort (generic triamcinolone) - please make sure to use the "cross-over" technique at a 45 degree angle towards the opposite eye as opposed to straight up the nasal passageway.   Sudafed (generic pseudoephedrine - Note this is the one that is available behind the pharmacy counter); Products with phenylephrine (-PE) may also be used but is often not as effective as pseudoephedrine.   If you have HIGH BLOOD PRESSURE - Coricidin HBP; AVOID any product that is -D as this contains pseudoephedrine which may increase your blood pressure.  Afrin (oxymetazoline) every 6-8 hours for up to 3 days.   Allergies - helps relieve runny nose, itchy eyes and sneezing   Claritin (generic loratidine), Allegra (fexofenidine), or Zyrtec (generic cyrterizine) for runny nose. These medications should not cause drowsiness.  Note - Benadryl (generic diphenhydramine) may be used however may cause drowsiness  Cough -   Delsym or Robitussin (generic dextromethorphan)  Expectorants - helps loosen  mucus to ease removal   Mucinex (generic guaifenesin) as directed on the package.  Headaches / General Aches   Tylenol (generic acetaminophen) - DO NOT EXCEED 3 grams (3,000 mg) in a 24 hour time period  Advil/Motrin (generic ibuprofen)   Sore Throat -   Salt water gargle   Chloraseptic (generic benzocaine) spray or lozenges / Sucrets (generic dyclonine)

## 2015-12-04 NOTE — Progress Notes (Signed)
Subjective:    Patient ID: Randy Knapp, male    DOB: 05-Jan-1956, 60 y.o.   MRN: [id number]  Chief Complaint  Patient presents with  . Cough    body aches, dry throat, and cough this has beeng going on since yesterday    HPI:  Randy Knapp is a 60 y.o. male who  has a past medical history of GERD (gastroesophageal reflux disease) and Prostatitis. and presents today for an acute office visit.   1.) Cough - This is a new problem. Associated symptom of body aches, dry throat, and cough have been going on for about 1 day. May have had a possible fever. Modifying factors include taking an antibiotic which was previously prescribed for dental work which appeared to help a little. Course of the symptoms has stayed about the same since initial onset.   No Known Allergies   Current Outpatient Prescriptions on File Prior to Visit  Medication Sig Dispense Refill  . Cholecalciferol (VITAMIN D3) 1000 UNITS tablet Take 1,000 Units by mouth daily.      Marland Kitchen. losartan (COZAAR) 100 MG tablet Take 1 tablet (100 mg total) by mouth daily. 30 tablet 11  . propranolol (INDERAL) 10 MG tablet Take 10 mg by mouth 2 (two) times daily as needed.      . traMADol (ULTRAM) 50 MG tablet Take 1-2 tablets (50-100 mg total) by mouth 2 (two) times daily as needed for pain. 100 tablet 3   No current facility-administered medications on file prior to visit.     Review of Systems  Constitutional: Negative for fever and chills.  Respiratory: Positive for cough. Negative for chest tightness.       Objective:    BP 122/80 mmHg  Pulse 62  Temp(Src) 98.8 F (37.1 C) (Oral)  Resp 18  Ht 6\' 1"  (1.854 m)  Wt 248 lb (112.492 kg)  BMI 32.73 kg/m2  SpO2 97% Nursing note and vital signs reviewed.  Physical Exam  Constitutional: He is oriented to person, place, and time. He appears well-developed and well-nourished. No distress.  HENT:  Right Ear: Hearing, tympanic membrane, external ear and ear canal  normal.  Left Ear: Hearing, tympanic membrane, external ear and ear canal normal.  Nose: Nose normal. Right sinus exhibits no maxillary sinus tenderness and no frontal sinus tenderness. Left sinus exhibits no maxillary sinus tenderness and no frontal sinus tenderness.  Mouth/Throat: Uvula is midline, oropharynx is clear and moist and mucous membranes are normal.  Cardiovascular: Normal rate, regular rhythm, normal heart sounds and intact distal pulses.   Pulmonary/Chest: Effort normal and breath sounds normal.  Neurological: He is alert and oriented to person, place, and time.  Skin: Skin is warm and dry.  Psychiatric: He has a normal mood and affect. His behavior is normal. Judgment and thought content normal.       Assessment & Plan:   Problem List Items Addressed This Visit      Respiratory   Acute upper respiratory infection - Primary    Symptoms and exam consistent with acute upper respiratory infection most likely viral. Patient adamant about having antibiotics considering that he will get worse without them. Discussed and reviewed antibiotic resistance if this is a viral infection. Written prescription for antibiotic provided with watchful waiting. Continue over-the-counter medications as needed for symptom relief and supportive care. Follow-up if symptoms worsen or do not improve.          I am having Mr. Schlag start on amoxicillin.  I am also having him maintain his propranolol, cholecalciferol, traMADol, and losartan.   Meds ordered this encounter  Medications  . amoxicillin (AMOXIL) 500 MG capsule    Sig: Take 1 capsule (500 mg total) by mouth 2 (two) times daily.    Dispense:  14 capsule    Refill:  0    Order Specific Question:  Supervising Provider    Answer:  Hillard Danker A [[id number]]     Follow-up: Return if symptoms worsen or fail to improve.  Jeanine Luz, FNP

## 2015-12-04 NOTE — Assessment & Plan Note (Signed)
Symptoms and exam consistent with acute upper respiratory infection most likely viral. Patient adamant about having antibiotics considering that he will get worse without them. Discussed and reviewed antibiotic resistance if this is a viral infection. Written prescription for antibiotic provided with watchful waiting. Continue over-the-counter medications as needed for symptom relief and supportive care. Follow-up if symptoms worsen or do not improve.

## 2016-04-07 ENCOUNTER — Other Ambulatory Visit: Payer: Self-pay | Admitting: Urology

## 2016-04-07 DIAGNOSIS — R972 Elevated prostate specific antigen [PSA]: Secondary | ICD-10-CM

## 2016-05-06 ENCOUNTER — Ambulatory Visit
Admission: RE | Admit: 2016-05-06 | Discharge: 2016-05-06 | Disposition: A | Discharge status: Home / Self Care | Payer: 59 | Source: Ambulatory Visit | Attending: Urology | Admitting: Urology

## 2016-05-06 DIAGNOSIS — R972 Elevated prostate specific antigen [PSA]: Secondary | ICD-10-CM

## 2016-05-06 MED ORDER — GADOBENATE DIMEGLUMINE 529 MG/ML IV SOLN
20.0000 mL | Freq: Once | INTRAVENOUS | Status: AC | PRN
  Administered 2016-05-06: 20 mL via INTRAVENOUS

## 2016-08-01 ENCOUNTER — Other Ambulatory Visit: Payer: Self-pay

## 2016-08-01 ENCOUNTER — Other Ambulatory Visit: Payer: Self-pay | Admitting: *Deleted

## 2016-08-01 ENCOUNTER — Other Ambulatory Visit (INDEPENDENT_AMBULATORY_CARE_PROVIDER_SITE_OTHER): Payer: 59

## 2016-08-01 DIAGNOSIS — Z0000 Encounter for general adult medical examination without abnormal findings: Secondary | ICD-10-CM

## 2016-08-01 DIAGNOSIS — R972 Elevated prostate specific antigen [PSA]: Secondary | ICD-10-CM

## 2016-08-01 LAB — HEPATIC FUNCTION PANEL
ALBUMIN: 4.2 g/dL (ref 3.5–5.2)
ALK PHOS: 79 U/L (ref 39–117)
ALT: 17 U/L (ref 0–53)
AST: 20 U/L (ref 0–37)
Bilirubin, Direct: 0.1 mg/dL (ref 0.0–0.3)
TOTAL PROTEIN: 7.2 g/dL (ref 6.0–8.3)
Total Bilirubin: 0.7 mg/dL (ref 0.2–1.2)

## 2016-08-01 LAB — BASIC METABOLIC PANEL
BUN: 13 mg/dL (ref 6–23)
CHLORIDE: 106 meq/L (ref 96–112)
CO2: 31 meq/L (ref 19–32)
CREATININE: 1.15 mg/dL (ref 0.40–1.50)
Calcium: 9.3 mg/dL (ref 8.4–10.5)
GFR: 69.3 mL/min (ref >60.00)
Glucose, Bld: 92 mg/dL (ref 70–99)
POTASSIUM: 4 meq/L (ref 3.5–5.1)
SODIUM: 144 meq/L (ref 135–145)

## 2016-08-01 LAB — URINALYSIS, ROUTINE W REFLEX MICROSCOPIC
Bilirubin Urine: NEGATIVE
HGB URINE DIPSTICK: NEGATIVE
KETONES UR: NEGATIVE
LEUKOCYTES UA: NEGATIVE
NITRITE: NEGATIVE
RBC / HPF: NONE SEEN (ref 0–?)
Specific Gravity, Urine: >=1.03 — AB (ref 1.000–1.030)
TOTAL PROTEIN, URINE-UPE24: NEGATIVE
URINE GLUCOSE: NEGATIVE
UROBILINOGEN UA: 0.2 (ref 0.0–1.0)
pH: 5.5 (ref 5.0–8.0)

## 2016-08-01 LAB — LIPID PANEL
CHOL/HDL RATIO: 4
Cholesterol: 153 mg/dL (ref 0–200)
HDL: 42.5 mg/dL (ref >39.00)
LDL CALC: 92 mg/dL (ref 0–99)
NONHDL: 110.17
TRIGLYCERIDES: 90 mg/dL (ref 0.0–149.0)
VLDL: 18 mg/dL (ref 0.0–40.0)

## 2016-08-01 LAB — CBC WITH DIFFERENTIAL/PLATELET
Basophils Absolute: 0 10*3/uL (ref 0.0–0.1)
Basophils Relative: 0.7 % (ref 0.0–3.0)
Eosinophils Absolute: 0.1 10*3/uL (ref 0.0–0.7)
Eosinophils Relative: 2 % (ref 0.0–5.0)
HEMATOCRIT: 43 % (ref 39.0–52.0)
HEMOGLOBIN: 13.9 g/dL (ref 13.0–17.0)
LYMPHS PCT: 37.7 % (ref 12.0–46.0)
Lymphs Abs: 1.8 10*3/uL (ref 0.7–4.0)
MCHC: 32.5 g/dL (ref 30.0–36.0)
MCV: 73.7 fl — AB (ref 78.0–100.0)
MONOS PCT: 9.5 % (ref 3.0–12.0)
Monocytes Absolute: 0.5 10*3/uL (ref 0.1–1.0)
NEUTROS ABS: 2.4 10*3/uL (ref 1.4–7.7)
Neutrophils Relative %: 50.1 % (ref 43.0–77.0)
PLATELETS: 211 10*3/uL (ref 150.0–400.0)
RBC: 5.83 Mil/uL — ABNORMAL HIGH (ref 4.22–5.81)
RDW: 17.9 % — ABNORMAL HIGH (ref 11.5–15.5)
WBC: 4.9 10*3/uL (ref 4.0–10.5)

## 2016-08-01 LAB — PSA: PSA: 6.16 ng/mL — AB (ref 0.10–4.00)

## 2016-08-01 LAB — TSH: TSH: 1.24 u[IU]/mL (ref 0.35–4.50)

## 2016-08-03 ENCOUNTER — Encounter: Payer: 59 | Admitting: Internal Medicine

## 2016-08-16 ENCOUNTER — Encounter: Payer: 59 | Admitting: Internal Medicine

## 2016-08-17 ENCOUNTER — Ambulatory Visit (INDEPENDENT_AMBULATORY_CARE_PROVIDER_SITE_OTHER): Payer: 59 | Admitting: Internal Medicine

## 2016-08-17 DIAGNOSIS — R972 Elevated prostate specific antigen [PSA]: Secondary | ICD-10-CM

## 2016-08-17 DIAGNOSIS — Z0000 Encounter for general adult medical examination without abnormal findings: Secondary | ICD-10-CM | POA: Diagnosis not present

## 2016-08-17 DIAGNOSIS — R030 Elevated blood-pressure reading, without diagnosis of hypertension: Secondary | ICD-10-CM | POA: Diagnosis not present

## 2016-08-17 DIAGNOSIS — M79672 Pain in left foot: Secondary | ICD-10-CM | POA: Diagnosis not present

## 2016-08-17 NOTE — Assessment & Plan Note (Signed)
BP Readings from Last 3 Encounters:  08/17/16 132/72  12/04/15 122/80  04/09/13 112/72

## 2016-08-17 NOTE — Progress Notes (Signed)
Pre visit review using our clinic review tool, if applicable. No additional management support is needed unless otherwise documented below in the visit note. 

## 2016-08-17 NOTE — Patient Instructions (Signed)
Shingrix

## 2016-08-17 NOTE — Progress Notes (Signed)
Subjective:  Patient ID: Randy Knapp, male    DOB: [birth year]  Age: 61 y.o. MRN: [id number]  CC: No chief complaint on file.   HPI Randy Knapp presents for a well exam C/o growth on L foot - numb L shoulder hurts   Outpatient Medications Prior to Visit  Medication Sig Dispense Refill  . Cholecalciferol (VITAMIN D3) 1000 UNITS tablet Take 1,000 Units by mouth daily.      Marland Kitchen. losartan (COZAAR) 100 MG tablet Take 1 tablet (100 mg total) by mouth daily. 30 tablet 11  . propranolol (INDERAL) 10 MG tablet Take 10 mg by mouth 2 (two) times daily as needed.      . traMADol (ULTRAM) 50 MG tablet Take 1-2 tablets (50-100 mg total) by mouth 2 (two) times daily as needed for pain. 100 tablet 3  . amoxicillin (AMOXIL) 500 MG capsule Take 1 capsule (500 mg total) by mouth 2 (two) times daily. (Patient not taking: Reported on 08/17/2016) 14 capsule 0   No facility-administered medications prior to visit.     ROS Review of Systems  Constitutional: Negative for appetite change, fatigue and unexpected weight change.  HENT: Negative for congestion, nosebleeds, sneezing, sore throat and trouble swallowing.   Eyes: Negative for itching and visual disturbance.  Respiratory: Negative for cough.   Cardiovascular: Negative for chest pain, palpitations and leg swelling.  Gastrointestinal: Negative for abdominal distention, blood in stool, diarrhea and nausea.  Genitourinary: Negative for frequency and hematuria.  Musculoskeletal: Negative for back pain, gait problem, joint swelling and neck pain.  Skin: Negative for rash.  Neurological: Negative for dizziness, tremors, speech difficulty and weakness.  Psychiatric/Behavioral: Negative for agitation, dysphoric mood, sleep disturbance and suicidal ideas. The patient is not nervous/anxious.     Objective:  BP 132/72   Pulse 70   Temp 98.4 F (36.9 C) (Oral)   Resp 20   Wt 265 lb 12 oz (120.5 kg)   SpO2 92%   BMI 35.06 kg/m   BP Readings  from Last 3 Encounters:  08/17/16 132/72  12/04/15 122/80  04/09/13 112/72    Wt Readings from Last 3 Encounters:  08/17/16 265 lb 12 oz (120.5 kg)  12/04/15 248 lb (112.5 kg)  04/09/13 267 lb (121.1 kg)    Physical Exam  Constitutional: He is oriented to person, place, and time. He appears well-developed and well-nourished. No distress.  HENT:  Head: Normocephalic and atraumatic.  Right Ear: External ear normal.  Left Ear: External ear normal.  Nose: Nose normal.  Mouth/Throat: Oropharynx is clear and moist. No oropharyngeal exudate.  Eyes: Conjunctivae and EOM are normal. Pupils are equal, round, and reactive to light. Right eye exhibits no discharge. Left eye exhibits no discharge. No scleral icterus.  Neck: Normal range of motion. Neck supple. No JVD present. No tracheal deviation present. No thyromegaly present.  Cardiovascular: Normal rate, regular rhythm, normal heart sounds and intact distal pulses.  Exam reveals no gallop and no friction rub.   No murmur heard. Pulmonary/Chest: Effort normal and breath sounds normal. No stridor. No respiratory distress. He has no wheezes. He has no rales. He exhibits no tenderness.  Abdominal: Soft. Bowel sounds are normal. He exhibits no distension and no mass. There is no tenderness. There is no rebound and no guarding.  Genitourinary: Rectum normal, prostate normal and penis normal. Rectal exam shows guaiac negative stool. No penile tenderness.  Musculoskeletal: Normal range of motion. He exhibits no edema or tenderness.  Lymphadenopathy:  He has no cervical adenopathy.  Neurological: He is alert and oriented to person, place, and time. He has normal reflexes. No cranial nerve deficit. He exhibits normal muscle tone. Coordination normal.  Skin: Skin is warm and dry. No rash noted. He is not diaphoretic. No erythema. No pallor.  Psychiatric: He has a normal mood and affect. His behavior is normal. Judgment and thought content normal.    growth on L foot - numb  Lab Results  Component Value Date   WBC 4.9 08/01/2016   HGB 13.9 08/01/2016   HCT 43.0 08/01/2016   PLT 211.0 08/01/2016   GLUCOSE 92 08/01/2016   CHOL 153 08/01/2016   TRIG 90.0 08/01/2016   HDL 42.50 08/01/2016   LDLCALC 92 08/01/2016   ALT 17 08/01/2016   AST 20 08/01/2016   NA 144 08/01/2016   K 4.0 08/01/2016   CL 106 08/01/2016   CREATININE 1.15 08/01/2016   BUN 13 08/01/2016   CO2 31 08/01/2016   TSH 1.24 08/01/2016   PSA 6.16 (H) 08/01/2016    Mr Prostate W / Wo Cm  Result Date: 05/06/2016 CLINICAL DATA:  Rising PSA.  Prior biopsy 2013.  PSA equal 6.9 EXAM: MR PROSTATE WITHOUT AND WITH CONTRAST TECHNIQUE: Multiplanar multisequence MRI images were obtained of the pelvis centered about the prostate. Pre and post contrast images were obtained. CONTRAST:  20mL MULTIHANCE GADOBENATE DIMEGLUMINE 529 MG/ML IV SOLN COMPARISON:  None. FINDINGS: Prostate: Single focus of low signal intensity within the medial LEFT base measures 8 mm x 6 mm (image 9, series 7). There is no clear restricted diffusion at this level. No convincing restricted diffusion in the peripheral zone. No abnormal enhancement. The transitional zone is nodular with well capsulated heterogeneous T2 signal nodules. Seminal vesicles are normal. Transcapsular spread:  Absent Seminal vesicle involvement: Absent Neurovascular bundle involvement: Absent Pelvic adenopathy: Small 4 mm external iliac lymph node on the LEFT. 5 mm external iliac lymph node on the LEFT on image 11, series 3. No common iliac adenopathy. Bone metastasis: Absent Other findings: Prostate gland measures 6.3 by 4.9 by 5.6 cm IMPRESSION: 1. Single focus of indeterminate signal abnormality in the LEFT base (PI-RADS 2) 2. Small external iliac lymph nodes measure 5 mm short axis. 3. Nodular transitional zone. Electronically Signed   By: Genevive Bi M.D.   On: 05/06/2016 15:46    Assessment & Plan:   There are no diagnoses  linked to this encounter. I am having [name] maintain his propranolol, cholecalciferol, traMADol, losartan, and amoxicillin.  No orders of the defined types were placed in this encounter.    Follow-up: No Follow-up on file.  Sonda Primes, MD

## 2016-08-17 NOTE — Assessment & Plan Note (Addendum)
We discussed age appropriate health related issues, including available/recomended screening tests and vaccinations. We discussed a need for adhering to healthy diet and exercise. Labs/EKG were reviewed/ordered. All questions were answered. Colon due 2020   

## 2016-08-17 NOTE — Assessment & Plan Note (Signed)
Dr Borden 

## 2016-08-17 NOTE — Assessment & Plan Note (Signed)
?  [facility]'s neuroma Dr Victorino DikeHewitt ref

## 2016-09-28 DIAGNOSIS — M79672 Pain in left foot: Secondary | ICD-10-CM | POA: Diagnosis not present

## 2016-09-28 DIAGNOSIS — G5762 Lesion of plantar nerve, left lower limb: Secondary | ICD-10-CM | POA: Diagnosis not present

## 2016-11-25 ENCOUNTER — Encounter: Payer: Self-pay | Admitting: Internal Medicine

## 2016-12-16 DIAGNOSIS — R972 Elevated prostate specific antigen [PSA]: Secondary | ICD-10-CM | POA: Diagnosis not present

## 2016-12-23 DIAGNOSIS — R972 Elevated prostate specific antigen [PSA]: Secondary | ICD-10-CM | POA: Diagnosis not present

## 2017-03-17 ENCOUNTER — Telehealth: Payer: Self-pay | Admitting: Internal Medicine

## 2017-03-17 MED ORDER — PROPRANOLOL HCL 10 MG PO TABS: 10.0000 mg | ORAL_TABLET | Freq: Two times a day (BID) | ORAL | 2 refills | Status: DC | PRN

## 2017-03-17 NOTE — Telephone Encounter (Signed)
Med refill

## 2017-08-28 ENCOUNTER — Other Ambulatory Visit: Payer: 59

## 2017-08-28 ENCOUNTER — Other Ambulatory Visit (INDEPENDENT_AMBULATORY_CARE_PROVIDER_SITE_OTHER): Payer: 59

## 2017-08-28 ENCOUNTER — Telehealth: Payer: Self-pay

## 2017-08-28 DIAGNOSIS — E785 Hyperlipidemia, unspecified: Secondary | ICD-10-CM

## 2017-08-28 DIAGNOSIS — N320 Bladder-neck obstruction: Secondary | ICD-10-CM | POA: Diagnosis not present

## 2017-08-28 LAB — URINALYSIS, ROUTINE W REFLEX MICROSCOPIC
BILIRUBIN URINE: NEGATIVE
Hgb urine dipstick: NEGATIVE
KETONES UR: NEGATIVE
Leukocytes, UA: NEGATIVE
Nitrite: NEGATIVE
RBC / HPF: NONE SEEN (ref 0–?)
Total Protein, Urine: NEGATIVE
Urine Glucose: NEGATIVE
Urobilinogen, UA: 0.2 (ref 0.0–1.0)
pH: 5.5 (ref 5.0–8.0)

## 2017-08-28 LAB — CBC
HEMATOCRIT: 45.5 % (ref 39.0–52.0)
Hemoglobin: 14.7 g/dL (ref 13.0–17.0)
MCHC: 32.3 g/dL (ref 30.0–36.0)
MCV: 76.9 fl — ABNORMAL LOW (ref 78.0–100.0)
Platelets: 208 10*3/uL (ref 150.0–400.0)
RBC: 5.92 Mil/uL — ABNORMAL HIGH (ref 4.22–5.81)
RDW: 16.4 % — ABNORMAL HIGH (ref 11.5–15.5)
WBC: 6.1 10*3/uL (ref 4.0–10.5)

## 2017-08-28 LAB — LIPID PANEL
CHOL/HDL RATIO: 4
CHOLESTEROL: 148 mg/dL (ref 0–200)
HDL: 34 mg/dL — ABNORMAL LOW (ref >39.00)
NonHDL: 113.64
Triglycerides: 250 mg/dL — ABNORMAL HIGH (ref 0.0–149.0)
VLDL: 50 mg/dL — ABNORMAL HIGH (ref 0.0–40.0)

## 2017-08-28 LAB — BASIC METABOLIC PANEL
BUN: 16 mg/dL (ref 6–23)
CALCIUM: 9.2 mg/dL (ref 8.4–10.5)
CHLORIDE: 106 meq/L (ref 96–112)
CO2: 26 mEq/L (ref 19–32)
Creatinine, Ser: 1.11 mg/dL (ref 0.40–1.50)
GFR: 71.92 mL/min (ref >60.00)
Glucose, Bld: 92 mg/dL (ref 70–99)
Potassium: 3.8 mEq/L (ref 3.5–5.1)
Sodium: 142 mEq/L (ref 135–145)

## 2017-08-28 LAB — HEPATIC FUNCTION PANEL
ALK PHOS: 87 U/L (ref 39–117)
ALT: 16 U/L (ref 0–53)
AST: 18 U/L (ref 0–37)
Albumin: 4.1 g/dL (ref 3.5–5.2)
BILIRUBIN DIRECT: 0.1 mg/dL (ref 0.0–0.3)
TOTAL PROTEIN: 7.2 g/dL (ref 6.0–8.3)
Total Bilirubin: 0.5 mg/dL (ref 0.2–1.2)

## 2017-08-28 LAB — PSA: PSA: 5.1 ng/mL — ABNORMAL HIGH (ref 0.10–4.00)

## 2017-08-28 LAB — LDL CHOLESTEROL, DIRECT: Direct LDL: 92 mg/dL

## 2017-08-29 ENCOUNTER — Encounter: Payer: Self-pay | Admitting: Internal Medicine

## 2017-08-29 ENCOUNTER — Ambulatory Visit: Payer: 59 | Admitting: Internal Medicine

## 2017-08-29 VITALS — BP 124/84 | HR 59 | Temp 98.5°F | Ht 73.0 in | Wt 273.0 lb

## 2017-08-29 DIAGNOSIS — Z0000 Encounter for general adult medical examination without abnormal findings: Secondary | ICD-10-CM

## 2017-08-29 DIAGNOSIS — R030 Elevated blood-pressure reading, without diagnosis of hypertension: Secondary | ICD-10-CM

## 2017-08-29 DIAGNOSIS — R972 Elevated prostate specific antigen [PSA]: Secondary | ICD-10-CM | POA: Diagnosis not present

## 2017-08-29 DIAGNOSIS — E785 Hyperlipidemia, unspecified: Secondary | ICD-10-CM | POA: Insufficient documentation

## 2017-08-29 DIAGNOSIS — N320 Bladder-neck obstruction: Secondary | ICD-10-CM

## 2017-08-29 DIAGNOSIS — H547 Unspecified visual loss: Secondary | ICD-10-CM

## 2017-08-29 NOTE — Assessment & Plan Note (Signed)
Dr Laverle PatterBorden q 12 mo PSA is better

## 2017-08-29 NOTE — Progress Notes (Signed)
Subjective:  Patient ID: Randy Knapp, male    DOB: March 14, 1956  Age: 62 y.o. MRN: [id number]  CC: No chief complaint on file.   HPI Randy Knapp presents for a well exam C/o blurred vision    Outpatient Medications Prior to Visit  Medication Sig Dispense Refill  . Cholecalciferol (VITAMIN D3) 1000 UNITS tablet Take 1,000 Units by mouth daily.      . propranolol (INDERAL) 10 MG tablet Take 1 tablet (10 mg total) by mouth 2 (two) times daily as needed. For palpitations (Patient not taking: Reported on 08/29/2017) 60 tablet 2   No facility-administered medications prior to visit.     ROS Review of Systems  Constitutional: Negative for appetite change, fatigue and unexpected weight change.  HENT: Negative for congestion, nosebleeds, sneezing, sore throat and trouble swallowing.   Eyes: Positive for visual disturbance. Negative for itching.  Respiratory: Negative for cough.   Cardiovascular: Negative for chest pain, palpitations and leg swelling.  Gastrointestinal: Negative for abdominal distention, blood in stool, diarrhea and nausea.  Genitourinary: Negative for frequency and hematuria.  Musculoskeletal: Negative for back pain, gait problem, joint swelling and neck pain.  Skin: Negative for rash.  Neurological: Negative for dizziness, tremors, speech difficulty and weakness.  Psychiatric/Behavioral: Negative for agitation, dysphoric mood and sleep disturbance. The patient is not nervous/anxious.     Objective:  BP 124/84 (BP Location: Left Arm, Patient Position: Sitting, Cuff Size: Large)   Pulse (!) 59   Temp 98.5 F (36.9 C) (Oral)   Ht 6\' 1"  (1.854 m)   Wt 273 lb (123.8 kg)   SpO2 98%   BMI 36.02 kg/m   BP Readings from Last 3 Encounters:  08/29/17 124/84  08/17/16 132/72  12/04/15 122/80    Wt Readings from Last 3 Encounters:  08/29/17 273 lb (123.8 kg)  08/17/16 265 lb 12 oz (120.5 kg)  12/04/15 248 lb (112.5 kg)    Physical Exam    Constitutional: He is oriented to person, place, and time. He appears well-developed. No distress.  NAD  HENT:  Mouth/Throat: Oropharynx is clear and moist.  Eyes: Conjunctivae are normal. Pupils are equal, round, and reactive to light.  Neck: Normal range of motion. No JVD present. No thyromegaly present.  Cardiovascular: Normal rate, regular rhythm, normal heart sounds and intact distal pulses. Exam reveals no gallop and no friction rub.  No murmur heard. Pulmonary/Chest: Effort normal and breath sounds normal. No respiratory distress. He has no wheezes. He has no rales. He exhibits no tenderness.  Abdominal: Soft. Bowel sounds are normal. He exhibits no distension and no mass. There is no tenderness. There is no rebound and no guarding.  Musculoskeletal: Normal range of motion. He exhibits no edema or tenderness.  Lymphadenopathy:    He has no cervical adenopathy.  Neurological: He is alert and oriented to person, place, and time. He has normal reflexes. No cranial nerve deficit. He exhibits normal muscle tone. He displays a negative Romberg sign. Coordination and gait normal.  Skin: Skin is warm and dry. No rash noted.  Psychiatric: He has a normal mood and affect. His behavior is normal. Judgment and thought content normal.  rectal - per urology; pt declined  Lab Results  Component Value Date   WBC 6.1 08/28/2017   HGB 14.7 08/28/2017   HCT 45.5 08/28/2017   PLT 208.0 08/28/2017   GLUCOSE 92 08/28/2017   CHOL 148 08/28/2017   TRIG 250.0 (H) 08/28/2017   HDL 34.00 (  L) 08/28/2017   LDLDIRECT 92.0 08/28/2017   LDLCALC 92 08/01/2016   ALT 16 08/28/2017   AST 18 08/28/2017   NA 142 08/28/2017   K 3.8 08/28/2017   CL 106 08/28/2017   CREATININE 1.11 08/28/2017   BUN 16 08/28/2017   CO2 26 08/28/2017   TSH 1.24 08/01/2016   PSA 5.10 (H) 08/28/2017    Mr Prostate W / Wo Cm  Result Date: 05/06/2016 CLINICAL DATA:  Rising PSA.  Prior biopsy 2013.  PSA equal 6.9 EXAM: MR  PROSTATE WITHOUT AND WITH CONTRAST TECHNIQUE: Multiplanar multisequence MRI images were obtained of the pelvis centered about the prostate. Pre and post contrast images were obtained. CONTRAST:  20mL MULTIHANCE GADOBENATE DIMEGLUMINE 529 MG/ML IV SOLN COMPARISON:  None. FINDINGS: Prostate: Single focus of low signal intensity within the medial LEFT base measures 8 mm x 6 mm (image 9, series 7). There is no clear restricted diffusion at this level. No convincing restricted diffusion in the peripheral zone. No abnormal enhancement. The transitional zone is nodular with well capsulated heterogeneous T2 signal nodules. Seminal vesicles are normal. Transcapsular spread:  Absent Seminal vesicle involvement: Absent Neurovascular bundle involvement: Absent Pelvic adenopathy: Small 4 mm external iliac lymph node on the LEFT. 5 mm external iliac lymph node on the LEFT on image 11, series 3. No common iliac adenopathy. Bone metastasis: Absent Other findings: Prostate gland measures 6.3 by 4.9 by 5.6 cm IMPRESSION: 1. Single focus of indeterminate signal abnormality in the LEFT base (PI-RADS 2) 2. Small external iliac lymph nodes measure 5 mm short axis. 3. Nodular transitional zone. Electronically Signed   By: Genevive Bi M.D.   On: 05/06/2016 15:46    Assessment & Plan:   Diagnoses and all orders for this visit:  Hyperlipidemia, unspecified hyperlipidemia type  Bladder neck obstruction  Other orders -     Cancel: CBC; Future -     Cancel: PSA; Future -     Cancel: Lipid Profile; Future -     Cancel: Basic Metabolic Panel (BMET); Future -     Cancel: Hepatic function panel; Future -     Cancel: Urinalysis, Routine w reflex microscopic; Future   I am having Bralin Y. Runnion maintain his cholecalciferol and propranolol.  No orders of the defined types were placed in this encounter.    Follow-up: No Follow-up on file.  Sonda Primes, MD

## 2017-08-29 NOTE — Assessment & Plan Note (Signed)
BP Readings from Last 3 Encounters:  08/29/17 124/84  08/17/16 132/72  12/04/15 122/80

## 2017-08-29 NOTE — Assessment & Plan Note (Addendum)
We discussed age appropriate health related issues, including available/recomended screening tests and vaccinations. We discussed a need for adhering to healthy diet and exercise. Labs were reviewed. All questions were answered. shingrix was discussed Colon due 2020

## 2017-08-29 NOTE — Patient Instructions (Signed)
Agave syrup  For Alzheimer's: Donepezil (Aricept) and Memantine (Namenda)

## 2017-08-29 NOTE — Assessment & Plan Note (Signed)
Will ref to Dr R.

## 2017-08-29 NOTE — Assessment & Plan Note (Addendum)
Reduce carbs in diet to help normalize his triglycerides.

## 2017-10-11 DIAGNOSIS — H53143 Visual discomfort, bilateral: Secondary | ICD-10-CM | POA: Diagnosis not present

## 2017-10-11 DIAGNOSIS — H25011 Cortical age-related cataract, right eye: Secondary | ICD-10-CM | POA: Diagnosis not present

## 2017-10-11 DIAGNOSIS — H2513 Age-related nuclear cataract, bilateral: Secondary | ICD-10-CM | POA: Insufficient documentation

## 2017-12-15 NOTE — Telephone Encounter (Signed)
error 

## 2018-02-09 DIAGNOSIS — R972 Elevated prostate specific antigen [PSA]: Secondary | ICD-10-CM | POA: Diagnosis not present

## 2018-02-16 DIAGNOSIS — R972 Elevated prostate specific antigen [PSA]: Secondary | ICD-10-CM | POA: Diagnosis not present

## 2018-04-03 ENCOUNTER — Encounter: Payer: Self-pay | Admitting: Gastroenterology

## 2018-05-25 ENCOUNTER — Encounter: Payer: Self-pay | Admitting: Gastroenterology

## 2018-05-28 ENCOUNTER — Other Ambulatory Visit: Payer: Self-pay | Admitting: Internal Medicine

## 2018-05-28 DIAGNOSIS — E785 Hyperlipidemia, unspecified: Secondary | ICD-10-CM

## 2018-06-04 ENCOUNTER — Other Ambulatory Visit: Payer: 59

## 2018-06-20 ENCOUNTER — Ambulatory Visit (INDEPENDENT_AMBULATORY_CARE_PROVIDER_SITE_OTHER)
Admission: RE | Admit: 2018-06-20 | Discharge: 2018-06-20 | Disposition: A | Discharge status: Home / Self Care | Payer: Self-pay | Source: Ambulatory Visit | Attending: Internal Medicine | Admitting: Internal Medicine

## 2018-06-20 DIAGNOSIS — E785 Hyperlipidemia, unspecified: Secondary | ICD-10-CM

## 2018-07-26 ENCOUNTER — Ambulatory Visit: Payer: 59 | Admitting: Internal Medicine

## 2018-07-26 ENCOUNTER — Encounter: Payer: Self-pay | Admitting: Internal Medicine

## 2018-07-26 VITALS — BP 126/74 | HR 60 | Temp 98.4°F | Ht 73.0 in | Wt 269.0 lb

## 2018-07-26 DIAGNOSIS — I2510 Atherosclerotic heart disease of native coronary artery without angina pectoris: Secondary | ICD-10-CM | POA: Diagnosis not present

## 2018-07-26 DIAGNOSIS — E785 Hyperlipidemia, unspecified: Secondary | ICD-10-CM

## 2018-07-26 DIAGNOSIS — K219 Gastro-esophageal reflux disease without esophagitis: Secondary | ICD-10-CM | POA: Diagnosis not present

## 2018-07-26 DIAGNOSIS — I2583 Coronary atherosclerosis due to lipid rich plaque: Secondary | ICD-10-CM

## 2018-07-26 MED ORDER — ROSUVASTATIN CALCIUM 5 MG PO TABS: 5.0000 mg | ORAL_TABLET | Freq: Every day | ORAL | 3 refills | Status: DC

## 2018-07-26 MED ORDER — ASPIRIN EC 81 MG PO TBEC: 81.0000 mg | DELAYED_RELEASE_TABLET | Freq: Every day | ORAL | 3 refills | Status: AC

## 2018-07-26 NOTE — Assessment & Plan Note (Signed)
Baby ASA Crestor low dose Card ref Dr Eden Emms

## 2018-07-26 NOTE — Assessment & Plan Note (Signed)
Crestor - low dose 

## 2018-07-26 NOTE — Assessment & Plan Note (Signed)
Chronic. 

## 2018-07-26 NOTE — Patient Instructions (Addendum)
Cardiac CT calcium scoring test   Atherosclerosis  Atherosclerosis is narrowing and hardening of the arteries. Arteries are blood vessels that carry blood from the heart to all parts of the body. This blood contains oxygen. Arteries can become narrow or clogged with a buildup of fat, cholesterol, calcium, and other substances (plaque). Plaque decreases the amount of blood that can flow through the artery. Atherosclerosis can affect any artery in the body, including:  Heart arteries (coronary artery disease). This may cause a heart attack.  Brain arteries. This may cause a stroke (cerebrovascular accident).  Leg, arm, and pelvis arteries (peripheral artery disease). This may cause pain and numbness.  Kidney arteries. This may cause kidney (renal) failure. Treatment may slow the disease and prevent further damage to the heart, brain, peripheral arteries, and kidneys. What are the causes? Atherosclerosis develops slowly over many years. The inner layers of your arteries become damaged and allow the gradual buildup of plaque. The exact cause of atherosclerosis is not fully understood. Symptoms of atherosclerosis do not occur until the artery becomes narrow or blocked. What increases the risk? The following factors may make you more likely to develop this condition:  High blood pressure.  High cholesterol.  Being middle-aged or older.  Having a family history of atherosclerosis.  Having high blood fats (triglycerides).  Diabetes.  Being overweight.  Smoking tobacco.  Not exercising enough (sedentary lifestyle).  Having a substance in the blood called C-reactive protein (CRP). This is a sign of increased levels of inflammation in the body.  Sleep apnea.  Being stressed.  Drinking too much alcohol. What are the signs or symptoms? This condition may not cause any symptoms. If you have symptoms, they are caused by damage to an area of your body that is not getting enough  blood.  Coronary artery disease may cause chest pain and shortness of breath.  Decreased blood supply to your brain may cause a stroke. Signs of a stroke may include sudden: ? Weakness on one side of the body. ? Confusion. ? Changes in vision. ? Inability to speak or understand speech. ? Loss of balance, coordination, or the ability to walk. ? Severe headache. ? Loss of consciousness.  Peripheral arterial disease may cause pain and numbness, often in the legs and hips.  Renal failure may cause fatigue, nausea, swelling, and itchy skin. How is this diagnosed? This condition is diagnosed based on your medical history and a physical exam. During the exam:  Your health care provider will: ? Check your pulse in different places. ? Listen for a "whooshing" sound over your arteries (bruit).  You may have tests, such as: ? Blood tests to check your levels of cholesterol, triglycerides, and CRP. ? Electrocardiogram (ECG) to check for heart damage. ? Chest X-ray to see if you have an enlarged heart, which is a sign of heart failure. ? Stress test to see how your heart reacts to exercise. ? Echocardiogram to get images of the inside of your heart. ? Ankle-brachial index to compare blood pressure in your arms to blood pressure in your ankles. ? Ultrasound of your peripheral arteries to check blood flow. ? CT scan to check for damage to your heart or brain. ? X-rays of blood vessels after dye has been injected (angiogram) to check blood flow. How is this treated? Treatment starts with lifestyle changes, which may include:  Changing your diet.  Losing weight.  Reducing stress.  Exercising and being physically active more regularly.  Not smoking. You  may also need medicine to:  Lower triglycerides and cholesterol.  Control blood pressure.  Prevent blood clots.  Lower inflammation in your body.  Control your blood sugar. Sometimes, surgery is needed to:  Remove plaque from  an artery (endarterectomy).  Open or widen a narrowed heart artery (angioplasty).  Create a new path for your blood with one of these procedures: ? Heart (coronary) artery bypass graft surgery. ? Peripheral artery bypass graft surgery. Follow these instructions at home: Eating and drinking   Eat a heart-healthy diet. Talk with your health care provider or a diet and nutrition specialist (dietitian) if you need help. A heart-healthy diet involves: ? Limiting unhealthy fats and increasing healthy fats. Some examples of healthy fats are olive oil and canola oil. ? Eating plant-based foods, such as fruits, vegetables, nuts, whole grains, and legumes (such as peas and lentils).  Limit alcohol intake to no more than 1 drink a day for nonpregnant women and 2 drinks a day for men. One drink equals 12 oz of beer, 5 oz of wine, or 1 oz of hard liquor. Lifestyle  Follow an exercise program as told by your health care provider.  Maintain a healthy weight. Lose weight if your health care provider says that you need to do that.  Rest when you are tired.  Learn to manage your stress.  Do not use any products that contain nicotine or tobacco, such as cigarettes and e-cigarettes. If you need help quitting, ask your health care provider.  Do not abuse drugs. General instructions  Take over-the-counter and prescription medicines only as told by your health care provider.  Manage other health conditions as told by your health care provider.  Keep all follow-up visits as told by your health care provider. This is important. Contact a health care provider if:  You have chest pain or discomfort. This includes squeezing chest pain that may feel like indigestion (angina).  You have shortness of breath.  You have an irregular heartbeat.  You have unexplained fatigue.  You have unexplained pain or numbness in an arm, leg, or hip.  You have nausea, swelling of your hands or feet, and itchy  skin. Get help right away if:  You have any symptoms of a heart attack, such as: ? Chest pain. ? Shortness of breath. ? Pain in your neck, jaw, arms, back, or stomach. ? Cold sweat. ? Nausea. ? Light-headedness.  You have any symptoms of a stroke. "BE FAST" is an easy way to remember the main warning signs of a stroke: ? B - Balance. Signs are dizziness, sudden trouble walking, or loss of balance. ? E - Eyes. Signs are trouble seeing or a sudden change in vision. ? F - Face. Signs are sudden weakness or numbness of the face, or the face or eyelid drooping on one side. ? A - Arms. Signs are weakness or numbness in an arm. This happens suddenly and usually on one side of the body. ? S - Speech. Signs are sudden trouble speaking, slurred speech, or trouble understanding what people say. ? T - Time. Time to call emergency services. Write down what time symptoms started.  You have other signs of a stroke, such as: ? A sudden, severe headache with no known cause. ? Nausea or vomiting. ? Seizure. These symptoms may represent a serious problem that is an emergency. Do not wait to see if the symptoms will go away. Get medical help right away. Call your local emergency services (911  in the [place] Do not drive yourself to the hospital. Summary  Atherosclerosis is narrowing and hardening of the arteries.  Arteries can become narrow or clogged with a buildup of fat, cholesterol, calcium, and other substances (plaque).  This condition may not cause any symptoms. If you do have symptoms, they are caused by damage to an area of your body that is not getting enough blood.  Treatment may include lifestyle changes and medicines. In some cases, surgery is needed. This information is not intended to replace advice given to you by your health care provider. Make sure you discuss any questions you have with your health care provider. Document Released: 09/17/2003 Document Revised: 10/06/2017 Document  Reviewed: 03/02/2017 Elsevier Interactive Patient Education  2019 [employer]  Calcium Score  Presence of CAD  0 No evidence of CAD   1-10 Minimal evidence of CAD  11-100 Mild evidence of CAD  101-400 Moderate evidence of CAD  Over 400 Extensive evidence of CAD

## 2018-07-26 NOTE — Progress Notes (Signed)
Subjective:  Patient ID: Randy Knapp, male    DOB: Nov 14, 1955  Age: 63 y.o. MRN: [id number]  CC: No chief complaint on file.   HPI Randy Knapp presents for abn CT ca scoring and dyslipidemia  Outpatient Medications Prior to Visit  Medication Sig Dispense Refill  . Cholecalciferol (VITAMIN D3) 1000 UNITS tablet Take 1,000 Units by mouth daily.      . propranolol (INDERAL) 10 MG tablet Take 1 tablet (10 mg total) by mouth 2 (two) times daily as needed. For palpitations (Patient not taking: Reported on 08/29/2017) 60 tablet 2   No facility-administered medications prior to visit.     ROS: Review of Systems  Constitutional: Negative for appetite change, fatigue and unexpected weight change.  HENT: Negative for congestion, nosebleeds, sneezing, sore throat and trouble swallowing.   Eyes: Negative for itching and visual disturbance.  Respiratory: Negative for cough.   Cardiovascular: Negative for chest pain, palpitations and leg swelling.  Gastrointestinal: Negative for abdominal distention, blood in stool, diarrhea and nausea.  Genitourinary: Negative for frequency and hematuria.  Musculoskeletal: Negative for back pain, gait problem, joint swelling and neck pain.  Skin: Negative for rash.  Neurological: Negative for dizziness, tremors, speech difficulty and weakness.  Psychiatric/Behavioral: Negative for agitation, dysphoric mood, sleep disturbance and suicidal ideas. The patient is not nervous/anxious.     Objective:  BP 126/74 (BP Location: Left Arm, Patient Position: Sitting, Cuff Size: Large)   Pulse 60   Temp 98.4 F (36.9 C) (Oral)   Ht 6\' 1"  (1.854 m)   Wt 269 lb (122 kg)   SpO2 95%   BMI 35.49 kg/m   BP Readings from Last 3 Encounters:  07/26/18 126/74  08/29/17 124/84  08/17/16 132/72    Wt Readings from Last 3 Encounters:  07/26/18 269 lb (122 kg)  08/29/17 273 lb (123.8 kg)  08/17/16 265 lb 12 oz (120.5 kg)    Physical  Exam Constitutional:      General: He is not in acute distress.    Appearance: He is well-developed.     Comments: NAD  Eyes:     Conjunctiva/sclera: Conjunctivae normal.     Pupils: Pupils are equal, round, and reactive to light.  Neck:     Musculoskeletal: Normal range of motion.     Thyroid: No thyromegaly.     Vascular: No JVD.  Cardiovascular:     Rate and Rhythm: Normal rate and regular rhythm.     Heart sounds: Normal heart sounds. No murmur. No friction rub. No gallop.   Pulmonary:     Effort: Pulmonary effort is normal. No respiratory distress.     Breath sounds: Normal breath sounds. No wheezing or rales.  Chest:     Chest wall: No tenderness.  Abdominal:     General: Bowel sounds are normal. There is no distension.     Palpations: Abdomen is soft. There is no mass.     Tenderness: There is no abdominal tenderness. There is no guarding or rebound.  Musculoskeletal: Normal range of motion.        General: No tenderness.  Lymphadenopathy:     Cervical: No cervical adenopathy.  Skin:    General: Skin is warm and dry.     Findings: No rash.  Neurological:     Mental Status: He is alert and oriented to person, place, and time.     Cranial Nerves: No cranial nerve deficit.     Motor: No abnormal muscle tone.  Coordination: Coordination normal.     Gait: Gait normal.     Deep Tendon Reflexes: Reflexes are normal and symmetric.  Psychiatric:        Behavior: Behavior normal.        Thought Content: Thought content normal.        Judgment: Judgment normal.    FTF>25 min discussing CAD   Lab Results  Component Value Date   WBC 6.1 08/28/2017   HGB 14.7 08/28/2017   HCT 45.5 08/28/2017   PLT 208.0 08/28/2017   GLUCOSE 92 08/28/2017   CHOL 148 08/28/2017   TRIG 250.0 (H) 08/28/2017   HDL 34.00 (L) 08/28/2017   LDLDIRECT 92.0 08/28/2017   LDLCALC 92 08/01/2016   ALT 16 08/28/2017   AST 18 08/28/2017   NA 142 08/28/2017   K 3.8 08/28/2017   CL 106  08/28/2017   CREATININE 1.11 08/28/2017   BUN 16 08/28/2017   CO2 26 08/28/2017   TSH 1.24 08/01/2016   PSA 5.10 (H) 08/28/2017    Ct Cardiac Scoring  Addendum Date: 06/21/2018   ADDENDUM REPORT: 06/21/2018 13:27 CLINICAL DATA:  Risk stratification EXAM: Coronary Calcium Score TECHNIQUE: The patient was scanned on a Siemens Somatom 64 slice scanner. Axial non-contrast 3 mm slices were carried out through the heart. The data set was analyzed on a dedicated work station and scored using the Agatson method. FINDINGS: Non-cardiac: [facility]. Ascending aorta: Normal diameter 3.7 cm Pericardium: Normal Coronary arteries: Calcium noted through out the proximal and mid LAD IMPRESSION: Coronary calcium score of 131. This was 5572 nd percentile for age and sex matched control. Charlton HawsPeter Nishan Electronically Signed   By: Charlton HawsPeter  Nishan M.D.   On: 06/21/2018 13:27   Result Date: 06/21/2018 EXAM: OVER-READ INTERPRETATION  CT CHEST The following report is an over-read performed by radiologist Dr. Charlett NoseKevin Dover of [facility] on 06/20/2018. This over-read does not include interpretation of cardiac or coronary anatomy or pathology. The coronary calcium score interpretation by the cardiologist is attached. COMPARISON:  None. FINDINGS: Vascular: Heart is upper limits normal in size. Aorta is normal caliber. Mediastinum/Nodes: No adenopathy in the lower mediastinum or hila. Lungs/Pleura: Visualized lungs clear.  No effusions. Upper Abdomen: Imaging into the upper abdomen shows no acute findings. Musculoskeletal: Chest wall soft tissues are unremarkable. No acute bony abnormality. IMPRESSION: No acute or significant extracardiac abnormality. Electronically Signed: By: Charlett NoseKevin  Dover M.D. On: 06/20/2018 16:45    Assessment & Plan:   There are no diagnoses linked to this encounter.   No orders of the defined types were placed in this encounter.    Follow-up: No follow-ups  on file.  Sonda PrimesAlex Marisabel Macpherson, MD

## 2018-08-02 ENCOUNTER — Encounter: Payer: Self-pay | Admitting: Cardiovascular Disease

## 2018-08-15 NOTE — Progress Notes (Deleted)
Cardiology Office Note   Date:  08/15/2018   ID:  Knapp, Randy [birth year], MRN [id number]  PCP:  Tresa Garter, MD  Cardiologist:   Charlton Haws, MD   No chief complaint on file.     History of Present Illness: Randy Knapp is a 63 y.o. male who presents for consultation regarding CAD. Referred by Dr Paulette Blanch Reviewed calcium score done 06/20/18 for risk stratification. 131 which was 72 nd percentile for age isolated to proximal And mid LAD Started on low dose crestor 07/26/18 Also has PRN inderal for history of palpitations  Had chest pain in 2014 with normal exercise stress echo Lipids from 08/28/17 showed TC 148 Triglycerides 250 And HDL 34 LDL 92   Past Medical History:  Diagnosis Date  . GERD (gastroesophageal reflux disease)   . Prostatitis     Past Surgical History:  Procedure Laterality Date  . ROTATOR CUFF REPAIR  2007   right     Current Outpatient Medications  Medication Sig Dispense Refill  . aspirin EC 81 MG tablet Take 1 tablet (81 mg total) by mouth daily. 100 tablet 3  . Cholecalciferol (VITAMIN D3) 1000 UNITS tablet Take 1,000 Units by mouth daily.      . rosuvastatin (CRESTOR) 5 MG tablet Take 1 tablet (5 mg total) by mouth daily. 90 tablet 3   No current facility-administered medications for this visit.     Allergies:   Patient has no known allergies.    Social History:  The patient  reports that he has never smoked. He has never used smokeless tobacco. He reports current alcohol use. He reports that he does not use drugs.   Family History:  The patient's family history includes Aneurysm in his father; Depression in his father.    ROS:  Please see the history of present illness.   Otherwise, review of systems are positive for none.   All other systems are reviewed and negative.    PHYSICAL EXAM: VS:  There were no vitals taken for this visit. , BMI There is no height or weight on file to calculate  BMI. Affect appropriate Healthy:  appears stated age HEENT: normal Neck supple with no adenopathy JVP normal no bruits no thyromegaly Lungs clear with no wheezing and good diaphragmatic motion Heart:  S1/S2 no murmur, no rub, gallop or click PMI normal Abdomen: benighn, BS positve, no tenderness, no AAA no bruit.  No HSM or HJR Distal pulses intact with no bruits No edema Neuro non-focal Skin warm and dry No muscular weakness    EKG:  SR Q wave in 3,F 01/09/13    Recent Labs: 08/28/2017: ALT 16; BUN 16; Creatinine, Ser 1.11; Hemoglobin 14.7; Platelets 208.0; Potassium 3.8; Sodium 142    Lipid Panel    Component Value Date/Time   CHOL 148 08/28/2017 1227   TRIG 250.0 (H) 08/28/2017 1227   HDL 34.00 (L) 08/28/2017 1227   CHOLHDL 4 08/28/2017 1227   VLDL 50.0 (H) 08/28/2017 1227   LDLCALC 92 08/01/2016 0824   LDLDIRECT 92.0 08/28/2017 1227      Wt Readings from Last 3 Encounters:  07/26/18 269 lb (122 kg)  08/29/17 273 lb (123.8 kg)  08/17/16 265 lb 12 oz (120.5 kg)      Other studies Reviewed: Additional studies/ records that were reviewed today include: stress echo and ECG 2014, calcium score Labs and notes from primary .    ASSESSMENT AND PLAN:  1.  CAD:  High calcium score for age 24/ 72% isolated to LAD. *** 2.  HLD on crestor labs with primary target LDL less than 70 low fat diet 3.  GERD:  Weight loss low carb diet 4.  Obesity :  See above    Current medicines are reviewed at length with the patient today.  The patient does not have concerns regarding medicines.  The following changes have been made:  no change  Labs/ tests ordered today include: ETT No orders of the defined types were placed in this encounter.    Disposition:   FU with cardiology in a year      Signed, Charlton HawsPeter Idabelle Mcpeters, MD  08/15/2018 5:44 PM    [facility] [address], [place], [place]  [address] Phone: [phone]) [phone]; Fax: [phone]) [phone]

## 2018-08-22 ENCOUNTER — Ambulatory Visit: Payer: 59 | Admitting: Cardiovascular Disease

## 2018-08-24 ENCOUNTER — Encounter: Payer: Self-pay | Admitting: Cardiovascular Disease

## 2018-09-10 ENCOUNTER — Ambulatory Visit (INDEPENDENT_AMBULATORY_CARE_PROVIDER_SITE_OTHER): Payer: 59 | Admitting: Internal Medicine

## 2018-09-10 ENCOUNTER — Other Ambulatory Visit (INDEPENDENT_AMBULATORY_CARE_PROVIDER_SITE_OTHER): Payer: 59

## 2018-09-10 ENCOUNTER — Encounter: Payer: Self-pay | Admitting: Internal Medicine

## 2018-09-10 VITALS — BP 136/84 | HR 59 | Temp 98.7°F | Ht 73.0 in | Wt 265.0 lb

## 2018-09-10 DIAGNOSIS — I2583 Coronary atherosclerosis due to lipid rich plaque: Secondary | ICD-10-CM

## 2018-09-10 DIAGNOSIS — Z0000 Encounter for general adult medical examination without abnormal findings: Secondary | ICD-10-CM

## 2018-09-10 DIAGNOSIS — R972 Elevated prostate specific antigen [PSA]: Secondary | ICD-10-CM

## 2018-09-10 DIAGNOSIS — Z1211 Encounter for screening for malignant neoplasm of colon: Secondary | ICD-10-CM

## 2018-09-10 DIAGNOSIS — E785 Hyperlipidemia, unspecified: Secondary | ICD-10-CM

## 2018-09-10 DIAGNOSIS — I2510 Atherosclerotic heart disease of native coronary artery without angina pectoris: Secondary | ICD-10-CM

## 2018-09-10 LAB — LIPID PANEL
Cholesterol: 106 mg/dL (ref 0–200)
HDL: 37.3 mg/dL — ABNORMAL LOW (ref >39.00)
LDL Cholesterol: 45 mg/dL (ref 0–99)
NonHDL: 69.02
Total CHOL/HDL Ratio: 3
Triglycerides: 122 mg/dL (ref 0.0–149.0)
VLDL: 24.4 mg/dL (ref 0.0–40.0)

## 2018-09-10 LAB — BASIC METABOLIC PANEL
BUN: 16 mg/dL (ref 6–23)
CO2: 29 mEq/L (ref 19–32)
Calcium: 9.5 mg/dL (ref 8.4–10.5)
Chloride: 106 mEq/L (ref 96–112)
Creatinine, Ser: 1.04 mg/dL (ref 0.40–1.50)
GFR: 72.7 mL/min (ref >60.00)
Glucose, Bld: 89 mg/dL (ref 70–99)
Potassium: 3.9 mEq/L (ref 3.5–5.1)
Sodium: 142 mEq/L (ref 135–145)

## 2018-09-10 LAB — CBC WITH DIFFERENTIAL/PLATELET
Basophils Absolute: 0.1 10*3/uL (ref 0.0–0.1)
Basophils Relative: 0.9 % (ref 0.0–3.0)
EOS ABS: 0.2 10*3/uL (ref 0.0–0.7)
Eosinophils Relative: 2.9 % (ref 0.0–5.0)
HCT: 46.1 % (ref 39.0–52.0)
Hemoglobin: 16.4 g/dL (ref 13.0–17.0)
Lymphocytes Relative: 26.7 % (ref 12.0–46.0)
Lymphs Abs: 1.8 10*3/uL (ref 0.7–4.0)
MCHC: 35.7 g/dL (ref 30.0–36.0)
MCV: 86.9 fl (ref 78.0–100.0)
Monocytes Absolute: 0.6 10*3/uL (ref 0.1–1.0)
Monocytes Relative: 9.4 % (ref 3.0–12.0)
Neutro Abs: 4 10*3/uL (ref 1.4–7.7)
Neutrophils Relative %: 60.1 % (ref 43.0–77.0)
Platelets: 182 10*3/uL (ref 150.0–400.0)
RBC: 5.3 Mil/uL (ref 4.22–5.81)
RDW: 13.3 % (ref 11.5–15.5)
WBC: 6.7 10*3/uL (ref 4.0–10.5)

## 2018-09-10 LAB — TSH: TSH: 1.07 u[IU]/mL (ref 0.35–4.50)

## 2018-09-10 LAB — HEPATIC FUNCTION PANEL
ALT: 20 U/L (ref 0–53)
AST: 20 U/L (ref 0–37)
Albumin: 4.3 g/dL (ref 3.5–5.2)
Alkaline Phosphatase: 68 U/L (ref 39–117)
Bilirubin, Direct: 0.2 mg/dL (ref 0.0–0.3)
Total Bilirubin: 0.7 mg/dL (ref 0.2–1.2)
Total Protein: 6.7 g/dL (ref 6.0–8.3)

## 2018-09-10 LAB — URINALYSIS
Bilirubin Urine: NEGATIVE
Hgb urine dipstick: NEGATIVE
KETONES UR: NEGATIVE
Leukocytes,Ua: NEGATIVE
Nitrite: NEGATIVE
Specific Gravity, Urine: >=1.03 — AB (ref 1.000–1.030)
Total Protein, Urine: NEGATIVE
Urine Glucose: NEGATIVE
Urobilinogen, UA: 0.2 (ref 0.0–1.0)
pH: 5.5 (ref 5.0–8.0)

## 2018-09-10 LAB — PSA: PSA: 4.79 ng/mL — ABNORMAL HIGH (ref 0.10–4.00)

## 2018-09-10 NOTE — Assessment & Plan Note (Signed)
Crestor,  ASA 

## 2018-09-10 NOTE — Assessment & Plan Note (Signed)
F/u w/Dr Laverle Patter - PSA was down

## 2018-09-10 NOTE — Assessment & Plan Note (Signed)
-   Crestor 

## 2018-09-10 NOTE — Progress Notes (Signed)
Subjective:  Patient ID: Randy Knapp, male    DOB: Nov 23, 1955  Age: 63 y.o. MRN: [id number]  CC: No chief complaint on file.   HPI Randy Knapp presents for a well exam. Just came from [place]  Outpatient Medications Prior to Visit  Medication Sig Dispense Refill  . aspirin EC 81 MG tablet Take 1 tablet (81 mg total) by mouth daily. 100 tablet 3  . Cholecalciferol (VITAMIN D3) 1000 UNITS tablet Take 1,000 Units by mouth daily.      . rosuvastatin (CRESTOR) 5 MG tablet Take 1 tablet (5 mg total) by mouth daily. 90 tablet 3   No facility-administered medications prior to visit.     ROS: Review of Systems  Constitutional: Negative for appetite change, fatigue and unexpected weight change.  HENT: Negative for congestion, nosebleeds, sneezing, sore throat and trouble swallowing.   Eyes: Negative for itching and visual disturbance.  Respiratory: Negative for cough.   Cardiovascular: Negative for chest pain, palpitations and leg swelling.  Gastrointestinal: Negative for abdominal distention, blood in stool, diarrhea and nausea.  Genitourinary: Negative for frequency and hematuria.  Musculoskeletal: Positive for arthralgias. Negative for back pain, gait problem, joint swelling and neck pain.  Skin: Negative for rash.  Neurological: Negative for dizziness, tremors, speech difficulty and weakness.  Psychiatric/Behavioral: Negative for agitation, dysphoric mood and sleep disturbance. The patient is not nervous/anxious.     Objective:  BP 136/84 (BP Location: Left Arm, Patient Position: Sitting, Cuff Size: Large)   Pulse (!) 59   Temp 98.7 F (37.1 C) (Oral)   Ht 6\' 1"  (1.854 m)   Wt 265 lb (120.2 kg)   SpO2 95%   BMI 34.96 kg/m   BP Readings from Last 3 Encounters:  09/10/18 136/84  07/26/18 126/74  08/29/17 124/84    Wt Readings from Last 3 Encounters:  09/10/18 265 lb (120.2 kg)  07/26/18 269 lb (122 kg)  08/29/17 273 lb (123.8 kg)     Physical Exam Constitutional:      General: He is not in acute distress.    Appearance: He is well-developed.     Comments: NAD  Eyes:     Conjunctiva/sclera: Conjunctivae normal.     Pupils: Pupils are equal, round, and reactive to light.  Neck:     Musculoskeletal: Normal range of motion.     Thyroid: No thyromegaly.     Vascular: No JVD.  Cardiovascular:     Rate and Rhythm: Normal rate and regular rhythm.     Heart sounds: Normal heart sounds. No murmur. No friction rub. No gallop.   Pulmonary:     Effort: Pulmonary effort is normal. No respiratory distress.     Breath sounds: Normal breath sounds. No wheezing or rales.  Chest:     Chest wall: No tenderness.  Abdominal:     General: Bowel sounds are normal. There is no distension.     Palpations: Abdomen is soft. There is no mass.     Tenderness: There is no abdominal tenderness. There is no guarding or rebound.  Musculoskeletal: Normal range of motion.        General: No tenderness.  Lymphadenopathy:     Cervical: No cervical adenopathy.  Skin:    General: Skin is warm and dry.     Findings: No rash.  Neurological:     Mental Status: He is alert and oriented to person, place, and time.     Cranial Nerves: No cranial nerve deficit.  Motor: No abnormal muscle tone.     Coordination: Coordination normal.     Gait: Gait normal.     Deep Tendon Reflexes: Reflexes are normal and symmetric.  Psychiatric:        Behavior: Behavior normal.        Thought Content: Thought content normal.        Judgment: Judgment normal.   prostate exam - per Dr Laverle Patter  Lab Results  Component Value Date   WBC 6.1 08/28/2017   HGB 14.7 08/28/2017   HCT 45.5 08/28/2017   PLT 208.0 08/28/2017   GLUCOSE 92 08/28/2017   CHOL 148 08/28/2017   TRIG 250.0 (H) 08/28/2017   HDL 34.00 (L) 08/28/2017   LDLDIRECT 92.0 08/28/2017   LDLCALC 92 08/01/2016   ALT 16 08/28/2017   AST 18 08/28/2017   NA 142 08/28/2017   K 3.8 08/28/2017    CL 106 08/28/2017   CREATININE 1.11 08/28/2017   BUN 16 08/28/2017   CO2 26 08/28/2017   TSH 1.24 08/01/2016   PSA 5.10 (H) 08/28/2017    Ct Cardiac Scoring  Addendum Date: 06/21/2018   ADDENDUM REPORT: 06/21/2018 13:27 CLINICAL DATA:  Risk stratification EXAM: Coronary Calcium Score TECHNIQUE: The patient was scanned on a Siemens Somatom 64 slice scanner. Axial non-contrast 3 mm slices were carried out through the heart. The data set was analyzed on a dedicated work station and scored using the Agatson method. FINDINGS: Non-cardiac: See separate report from [facility]. Ascending aorta: Normal diameter 3.7 cm Pericardium: Normal Coronary arteries: Calcium noted through out the proximal and mid LAD IMPRESSION: Coronary calcium score of 131. This was 12 nd percentile for age and sex matched control. Charlton Haws Electronically Signed   By: Charlton Haws M.D.   On: 06/21/2018 13:27   Result Date: 06/21/2018 EXAM: OVER-READ INTERPRETATION  CT CHEST The following report is an over-read performed by radiologist Dr. Charlett Nose of [facility] on 06/20/2018. This over-read does not include interpretation of cardiac or coronary anatomy or pathology. The coronary calcium score interpretation by the cardiologist is attached. COMPARISON:  None. FINDINGS: Vascular: Heart is upper limits normal in size. Aorta is normal caliber. Mediastinum/Nodes: No adenopathy in the lower mediastinum or hila. Lungs/Pleura: Visualized lungs clear.  No effusions. Upper Abdomen: Imaging into the upper abdomen shows no acute findings. Musculoskeletal: Chest wall soft tissues are unremarkable. No acute bony abnormality. IMPRESSION: No acute or significant extracardiac abnormality. Electronically Signed: By: Charlett Nose M.D. On: 06/20/2018 16:45    Assessment & Plan:   There are no diagnoses linked to this encounter.   No orders of the defined types were placed in this encounter.    Follow-up: No  follow-ups on file.  Sonda Primes, MD

## 2018-09-10 NOTE — Assessment & Plan Note (Signed)
We discussed age appropriate health related issues, including available/recomended screening tests and vaccinations. We discussed a need for adhering to healthy diet and exercise. Labs/EKG were reviewed/ordered. All questions were answered. Colon due 2020   

## 2018-09-19 ENCOUNTER — Ambulatory Visit: Payer: 59 | Admitting: Cardiology

## 2018-09-25 ENCOUNTER — Telehealth: Payer: Self-pay

## 2018-09-25 NOTE — Telephone Encounter (Signed)
Spoke with the patient's wife she expressed understanding about canceling his appointment.

## 2018-09-26 ENCOUNTER — Ambulatory Visit: Payer: 59 | Admitting: Cardiology

## 2018-10-29 ENCOUNTER — Other Ambulatory Visit: Payer: Self-pay | Admitting: Internal Medicine

## 2018-10-29 MED ORDER — LOSARTAN POTASSIUM 100 MG PO TABS: 100.0000 mg | ORAL_TABLET | Freq: Every day | ORAL | 3 refills | Status: DC

## 2018-10-31 ENCOUNTER — Ambulatory Visit (INDEPENDENT_AMBULATORY_CARE_PROVIDER_SITE_OTHER): Payer: 59 | Admitting: Internal Medicine

## 2018-10-31 ENCOUNTER — Encounter: Payer: Self-pay | Admitting: Internal Medicine

## 2018-10-31 DIAGNOSIS — I10 Essential (primary) hypertension: Secondary | ICD-10-CM | POA: Diagnosis not present

## 2018-10-31 DIAGNOSIS — E785 Hyperlipidemia, unspecified: Secondary | ICD-10-CM

## 2018-10-31 DIAGNOSIS — I2510 Atherosclerotic heart disease of native coronary artery without angina pectoris: Secondary | ICD-10-CM | POA: Diagnosis not present

## 2018-10-31 DIAGNOSIS — I2583 Coronary atherosclerosis due to lipid rich plaque: Secondary | ICD-10-CM

## 2018-10-31 NOTE — Assessment & Plan Note (Signed)
started Losartan, NAS diet Better

## 2018-10-31 NOTE — Telephone Encounter (Signed)
-----   Message from [place], [place] sent at 10/31/2018 10:04 AM EDT -----  ----- Message ----- From: Tresa Garter, MD Sent: 10/25/2018   4:29 PM EDT To: Scarlett Presto, CMA  Betty, Please sch VOV w/me next week for HTN f/u Thanks, AP

## 2018-10-31 NOTE — Telephone Encounter (Signed)
Left message for patient to call back to schedule an appointment.  °

## 2018-10-31 NOTE — Assessment & Plan Note (Signed)
Crestor,  ASA 

## 2018-10-31 NOTE — Progress Notes (Signed)
Virtual Visit via Telephone Note  I connected with Randy Knapp on 10/31/18 at  4:00 PM EDT by telephone and verified that I am speaking with the correct person using two identifiers.   I discussed the limitations, risks, security and privacy concerns of performing an evaluation and management service by telephone and the availability of in person appointments. I also discussed with the patient that there may be a patient responsible charge related to this service. The patient expressed understanding and agreed to proceed.   History of Present Illness:    Observations/Objective:   Assessment and Plan:   Follow Up Instructions:    I discussed the assessment and treatment plan with the patient. The patient was provided an opportunity to ask questions and all were answered. The patient agreed with the plan and demonstrated an understanding of the instructions.   The patient was advised to call back or seek an in-person evaluation if the symptoms worsen or if the condition fails to improve as anticipated.  I provided 20 minutes of non-face-to-face time during this encounter.   Sonda Primes, MD

## 2018-10-31 NOTE — Assessment & Plan Note (Signed)
Crestor - low dose 

## 2018-11-22 DIAGNOSIS — H5371 Glare sensitivity: Secondary | ICD-10-CM | POA: Diagnosis not present

## 2018-11-22 DIAGNOSIS — H25011 Cortical age-related cataract, right eye: Secondary | ICD-10-CM | POA: Diagnosis not present

## 2018-11-22 DIAGNOSIS — H2513 Age-related nuclear cataract, bilateral: Secondary | ICD-10-CM | POA: Diagnosis not present

## 2018-12-28 ENCOUNTER — Encounter: Payer: Self-pay | Admitting: Gastroenterology

## 2019-01-08 ENCOUNTER — Ambulatory Visit (INDEPENDENT_AMBULATORY_CARE_PROVIDER_SITE_OTHER): Payer: 59 | Admitting: Internal Medicine

## 2019-01-08 ENCOUNTER — Encounter: Payer: Self-pay | Admitting: Internal Medicine

## 2019-01-08 DIAGNOSIS — R0609 Other forms of dyspnea: Secondary | ICD-10-CM

## 2019-01-08 DIAGNOSIS — I10 Essential (primary) hypertension: Secondary | ICD-10-CM

## 2019-01-08 DIAGNOSIS — K219 Gastro-esophageal reflux disease without esophagitis: Secondary | ICD-10-CM | POA: Diagnosis not present

## 2019-01-08 DIAGNOSIS — R0600 Dyspnea, unspecified: Secondary | ICD-10-CM

## 2019-01-08 MED ORDER — DULERA 200-5 MCG/ACT IN AERO: 2.0000 | INHALATION_SPRAY | Freq: Two times a day (BID) | RESPIRATORY_TRACT | 0 refills | Status: DC

## 2019-01-08 MED ORDER — PANTOPRAZOLE SODIUM 40 MG PO TBEC: 40.0000 mg | DELAYED_RELEASE_TABLET | Freq: Every day | ORAL | 3 refills | Status: DC

## 2019-01-08 NOTE — Assessment & Plan Note (Signed)
Protonix.  ?

## 2019-01-08 NOTE — Assessment & Plan Note (Addendum)
2020 DOE - asthmatic bronchitis post-URI, GERD, deconditioning CXR Treat GERD COVID 19 IgG

## 2019-01-08 NOTE — Progress Notes (Signed)
Virtual Visit via Video Note  I connected with Randy Knapp on 01/08/19 at  3:20 PM EDT by a video enabled telemedicine application and verified that I am speaking with the correct person using two identifiers.   I discussed the limitations of evaluation and management by telemedicine and the availability of in person appointments. The patient expressed understanding and agreed to proceed.  History of Present Illness: C/o URI in March after visiting [place]. C/o DOE since then and cough w/exercise. C/o GERD. Not exercising much. F/u HTN  There has been no runny nose, chest pain,  abdominal pain, diarrhea, constipation, arthralgias, skin rashes. No wheezing   Observations/Objective: The patient appears to be in no acute distress, looks well.  Assessment and Plan:  See my Assessment and Plan. Follow Up Instructions:    I discussed the assessment and treatment plan with the patient. The patient was provided an opportunity to ask questions and all were answered. The patient agreed with the plan and demonstrated an understanding of the instructions.   The patient was advised to call back or seek an in-person evaluation if the symptoms worsen or if the condition fails to improve as anticipated.  I provided face-to-face time during this encounter. We were at different locations.   Walker Kehr, MD

## 2019-01-08 NOTE — Assessment & Plan Note (Signed)
Losartan 

## 2019-01-10 ENCOUNTER — Ambulatory Visit (INDEPENDENT_AMBULATORY_CARE_PROVIDER_SITE_OTHER)
Admission: RE | Admit: 2019-01-10 | Discharge: 2019-01-10 | Disposition: A | Discharge status: Home / Self Care | Payer: 59 | Source: Ambulatory Visit | Attending: Internal Medicine | Admitting: Internal Medicine

## 2019-01-10 ENCOUNTER — Other Ambulatory Visit: Payer: Self-pay

## 2019-01-10 ENCOUNTER — Other Ambulatory Visit: Payer: 59

## 2019-01-10 DIAGNOSIS — R0609 Other forms of dyspnea: Secondary | ICD-10-CM

## 2019-01-10 DIAGNOSIS — K219 Gastro-esophageal reflux disease without esophagitis: Secondary | ICD-10-CM

## 2019-01-10 DIAGNOSIS — R0600 Dyspnea, unspecified: Secondary | ICD-10-CM

## 2019-01-11 LAB — SAR COV2 SEROLOGY (COVID19)AB(IGG),IA: SARS CoV2 AB IGG: NEGATIVE

## 2019-01-28 ENCOUNTER — Encounter: Payer: Self-pay | Admitting: Gastroenterology

## 2019-02-13 ENCOUNTER — Telehealth: Payer: Self-pay | Admitting: *Deleted

## 2019-02-13 NOTE — Telephone Encounter (Signed)
Patient no show PV today. Patient was called x2 on cell # line is busy, x2 on home # someone picks phone up and hangs up the phone, they will not say anything. Unable to leave a message. No show letter mailed to pt and colon and PV cancelled.

## 2019-02-13 NOTE — Telephone Encounter (Signed)
Patient called back and rescheduled PV and colonoscopy.

## 2019-02-14 ENCOUNTER — Ambulatory Visit (AMBULATORY_SURGERY_CENTER): Payer: Self-pay

## 2019-02-14 ENCOUNTER — Other Ambulatory Visit: Payer: Self-pay

## 2019-02-14 VITALS — Temp 96.2°F | Ht 73.0 in | Wt 250.0 lb

## 2019-02-14 DIAGNOSIS — Z1211 Encounter for screening for malignant neoplasm of colon: Secondary | ICD-10-CM

## 2019-02-14 MED ORDER — PEG 3350-KCL-NA BICARB-NACL 420 G PO SOLR: 4000.0000 mL | Freq: Once | ORAL | 0 refills | Status: AC

## 2019-02-14 NOTE — Progress Notes (Signed)
Per pt, no allergies to soy or egg products.Pt not taking any weight loss meds or using  O2 at home.  Pt denies sedation problems.   Pt refused emmi video.  

## 2019-02-26 ENCOUNTER — Telehealth: Payer: Self-pay

## 2019-02-26 NOTE — Telephone Encounter (Signed)
Covid-19 screening questions   Do you now or have you had a fever in the last 14 days? NO  Do you have any respiratory symptoms of shortness of breath or cough now or in the last 14 days? NO  Do you have any family members or close contacts with diagnosed or suspected Covid-19 in the past 14 days? NO  Have you been tested for Covid-19 and found to be positive? NO     Confirmed with patient   

## 2019-02-27 ENCOUNTER — Ambulatory Visit (AMBULATORY_SURGERY_CENTER): Payer: 59 | Admitting: Gastroenterology

## 2019-02-27 ENCOUNTER — Other Ambulatory Visit: Payer: Self-pay

## 2019-02-27 ENCOUNTER — Encounter: Payer: Self-pay | Admitting: Gastroenterology

## 2019-02-27 ENCOUNTER — Encounter: Payer: 59 | Admitting: Gastroenterology

## 2019-02-27 VITALS — BP 113/73 | HR 52 | Temp 96.6°F | Resp 18 | Ht 73.0 in | Wt 250.0 lb

## 2019-02-27 DIAGNOSIS — Z1211 Encounter for screening for malignant neoplasm of colon: Secondary | ICD-10-CM

## 2019-02-27 MED ORDER — SODIUM CHLORIDE 0.9 % IV SOLN: 500.0000 mL | Freq: Once | INTRAVENOUS | Status: DC

## 2019-02-27 NOTE — Progress Notes (Signed)
JB- Temp CW- Vitals 

## 2019-02-27 NOTE — Patient Instructions (Signed)
Thank you for allowing [place] to care for you today!  Resume previous diet and medications today.    Return to your normal activities ,including driving, tomorrow.  Recommend next screening colonoscopy in 10 years.      YOU HAD AN ENDOSCOPIC PROCEDURE TODAY AT [facility]:   Refer to the procedure report that was given to you for any specific questions about what was found during the examination.  If the procedure report does not answer your questions, please call your gastroenterologist to clarify.  If you requested that your care partner not be given the details of your procedure findings, then the procedure report has been included in a sealed envelope for you to review at your convenience later.  YOU SHOULD EXPECT: Some feelings of bloating in the abdomen. Passage of more gas than usual.  Walking can help get rid of the air that was put into your GI tract during the procedure and reduce the bloating. If you had a lower endoscopy (such as a colonoscopy or flexible sigmoidoscopy) you may notice spotting of blood in your stool or on the toilet paper. If you underwent a bowel prep for your procedure, you may not have a normal bowel movement for a few days.  Please Note:  You might notice some irritation and congestion in your nose or some drainage.  This is from the oxygen used during your procedure.  There is no need for concern and it should clear up in a day or so.  SYMPTOMS TO REPORT IMMEDIATELY:   Following lower endoscopy (colonoscopy or flexible sigmoidoscopy):  Excessive amounts of blood in the stool  Significant tenderness or worsening of abdominal pains  Swelling of the abdomen that is new, acute  Fever of 100F or higher    For urgent or emergent issues, a gastroenterologist can be reached at any hour by calling [phone].   DIET:  We do recommend a small meal at first, but then you may proceed to your regular diet.  Drink plenty of fluids but you should  avoid alcoholic beverages for 24 hours.  ACTIVITY:  You should plan to take it easy for the rest of today and you should NOT DRIVE or use heavy machinery until tomorrow (because of the sedation medicines used during the test).    FOLLOW UP: Our staff will call the number listed on your records 48-72 hours following your procedure to check on you and address any questions or concerns that you may have regarding the information given to you following your procedure. If we do not reach you, we will leave a message.  We will attempt to reach you two times.  During this call, we will ask if you have developed any symptoms of COVID 19. If you develop any symptoms (ie: fever, flu-like symptoms, shortness of breath, cough etc.) before then, please call [phone].  If you test positive for Covid 19 in the 2 weeks post procedure, please call and report this information to [place].    If any biopsies were taken you will be contacted by phone or by letter within the next 1-3 weeks.  Please call us at [phone] if you have not heard about the biopsies in 3 weeks.    SIGNATURES/CONFIDENTIALITY: You and/or your care partner have signed paperwork which will be entered into your electronic medical record.  These signatures attest to the fact that that the information above on your After Visit Summary has been reviewed and is understood.  Full responsibility of the confidentiality of this discharge information lies with you and/or your care-partner.

## 2019-02-27 NOTE — Progress Notes (Signed)
Report to PACU, RN, vss, BBS= Clear.  

## 2019-02-27 NOTE — Op Note (Signed)
[facility] Patient Name: Randy Knapp Procedure Date: 02/27/2019 8:14 AM MRN: [id number] Endoscopist: Milus Banister , MD Age: 63 Referring MD:  Date of Birth: Dec 01, 1955 Gender: Male Account #: [id number] Procedure:                Colonoscopy Indications:              Screening for colorectal malignant neoplasm Medicines:                Monitored Anesthesia Care Procedure:                Pre-Anesthesia Assessment:                           - Prior to the procedure, a History and Physical                            was performed, and patient medications and                            allergies were reviewed. The patient's tolerance of                            previous anesthesia was also reviewed. The risks                            and benefits of the procedure and the sedation                            options and risks were discussed with the patient.                            All questions were answered, and informed consent                            was obtained. Prior Anticoagulants: The patient has                            taken no previous anticoagulant or antiplatelet                            agents. ASA Grade Assessment: II - A patient with                            mild systemic disease. After reviewing the risks                            and benefits, the patient was deemed in                            satisfactory condition to undergo the procedure.                           After obtaining informed consent, the colonoscope  was passed under direct vision. Throughout the                            procedure, the patient's blood pressure, pulse, and                            oxygen saturations were monitored continuously. The                            Colonoscope was introduced through the anus and                            advanced to the the cecum, identified by                            appendiceal orifice and  ileocecal valve. The                            colonoscopy was performed without difficulty. The                            patient tolerated the procedure well. The quality                            of the bowel preparation was good. The ileocecal                            valve, appendiceal orifice, and rectum were                            photographed. Scope In: 8:18:01 AM Scope Out: 8:34:47 AM Scope Withdrawal Time: 0 hours 9 minutes 48 seconds  Total Procedure Duration: 0 hours 16 minutes 46 seconds  Findings:                 The entire examined colon appeared normal on direct                            and retroflexion views. Complications:            No immediate complications. Estimated blood loss:                            None. Estimated Blood Loss:     Estimated blood loss: none. Impression:               - The entire examined colon is normal on direct and                            retroflexion views.                           - No polyps or cancers. Recommendation:           - Patient has a contact number available for  emergencies. The signs and symptoms of potential                            delayed complications were discussed with the                            patient. Return to normal activities tomorrow.                            Written discharge instructions were provided to the                            patient.                           - Resume previous diet.                           - Continue present medications.                           - Repeat colonoscopy in 10 years for screening. Rachael Feeaniel P Jacobs, MD 02/27/2019 8:38:01 AM This report has been signed electronically.

## 2019-02-27 NOTE — Progress Notes (Signed)
Pt's states no medical or surgical changes since previsit or office visit. 

## 2019-03-01 ENCOUNTER — Telehealth: Payer: Self-pay | Admitting: *Deleted

## 2019-03-01 ENCOUNTER — Telehealth: Payer: Self-pay

## 2019-03-01 NOTE — Telephone Encounter (Signed)
  Follow up Call-  Call back number 02/27/2019  Post procedure Call Back phone  # [phone]  Permission to leave phone message Yes  Some recent data might be hidden     Patient questions:  Do you have a fever, pain , or abdominal swelling? No. Pain Score  0 *  Have you tolerated food without any problems? Yes.    Have you been able to return to your normal activities? Yes.    Do you have any questions about your discharge instructions: Diet   No. Medications  No. Follow up visit  No.  Do you have questions or concerns about your Care? No.  Actions: * If pain score is 4 or above: No action needed, pain <4.  1. Have you developed a fever since your procedure? no  2.   Have you had an respiratory symptoms (SOB or cough) since your procedure? no  3.   Have you tested positive for COVID 19 since your procedure no  4.   Have you had any family members/close contacts diagnosed with the COVID 19 since your procedure?  no   If yes to any of these questions please route to Joylene John, RN and Alphonsa Gin, Therapist, sports.

## 2019-03-01 NOTE — Telephone Encounter (Signed)
First attempt follow up call to pt, left message for pt to call if problems or questions, otherwise we will call back after noon today.

## 2019-07-17 ENCOUNTER — Other Ambulatory Visit: Payer: Self-pay | Admitting: Internal Medicine

## 2019-08-19 ENCOUNTER — Ambulatory Visit (INDEPENDENT_AMBULATORY_CARE_PROVIDER_SITE_OTHER): Payer: 59 | Admitting: Internal Medicine

## 2019-08-19 DIAGNOSIS — J069 Acute upper respiratory infection, unspecified: Secondary | ICD-10-CM | POA: Diagnosis not present

## 2019-08-19 MED ORDER — AZITHROMYCIN 250 MG PO TABS: ORAL_TABLET | ORAL | 0 refills | Status: DC

## 2019-08-19 MED ORDER — PROMETHAZINE-CODEINE 6.25-10 MG/5ML PO SYRP: 5.0000 mL | ORAL_SOLUTION | ORAL | 0 refills | Status: DC | PRN

## 2019-08-19 NOTE — Progress Notes (Signed)
Virtual Visit via Video Note  I connected with Arizona Constable on 08/19/19 at  3:40 PM EST by a video enabled telemedicine application and verified that I am speaking with the correct person using two identifiers.   I discussed the limitations of evaluation and management by telemedicine and the availability of in person appointments. The patient expressed understanding and agreed to proceed.  History of Present Illness: C/o bad cough, spastic w/yellow sputum; HAs, not feeling well chills x 2-3 d. He had a COVID test at [facility] today-no results are yet available  There has been no  shortness of breath, abdominal pain, diarrhea, constipation,  skin rashes.   Observations/Objective: The patient appears to be in no acute distress  Assessment and Plan:  See my Assessment and Plan. Follow Up Instructions:    I discussed the assessment and treatment plan with the patient. The patient was provided an opportunity to ask questions and all were answered. The patient agreed with the plan and demonstrated an understanding of the instructions.   The patient was advised to call back or seek an in-person evaluation if the symptoms worsen or if the condition fails to improve as anticipated.  I provided face-to-face time during this encounter. We were at different locations.   Sonda Primes, MD

## 2019-08-19 NOTE — Assessment & Plan Note (Addendum)
2/21 acute bronchitis Zpac Prom-Cod syr COVID test pending Call if issues

## 2019-08-20 ENCOUNTER — Encounter: Payer: Self-pay | Admitting: Internal Medicine

## 2019-10-12 ENCOUNTER — Other Ambulatory Visit: Payer: Self-pay | Admitting: Internal Medicine

## 2019-10-22 ENCOUNTER — Other Ambulatory Visit: Payer: Self-pay | Admitting: Internal Medicine

## 2019-10-22 DIAGNOSIS — R1013 Epigastric pain: Secondary | ICD-10-CM

## 2019-11-05 ENCOUNTER — Ambulatory Visit: Payer: 59 | Admitting: Nurse Practitioner

## 2019-11-05 ENCOUNTER — Other Ambulatory Visit (INDEPENDENT_AMBULATORY_CARE_PROVIDER_SITE_OTHER): Payer: 59

## 2019-11-05 ENCOUNTER — Encounter: Payer: Self-pay | Admitting: Nurse Practitioner

## 2019-11-05 VITALS — BP 110/80 | HR 64 | Temp 98.9°F | Ht 70.75 in | Wt 263.1 lb

## 2019-11-05 DIAGNOSIS — R1011 Right upper quadrant pain: Secondary | ICD-10-CM

## 2019-11-05 LAB — COMPREHENSIVE METABOLIC PANEL
ALT: 23 U/L (ref 0–53)
AST: 22 U/L (ref 0–37)
Albumin: 4.5 g/dL (ref 3.5–5.2)
Alkaline Phosphatase: 73 U/L (ref 39–117)
BUN: 20 mg/dL (ref 6–23)
CO2: 32 mEq/L (ref 19–32)
Calcium: 9.8 mg/dL (ref 8.4–10.5)
Chloride: 104 mEq/L (ref 96–112)
Creatinine, Ser: 1.12 mg/dL (ref 0.40–1.50)
GFR: 66.49 mL/min (ref >60.00)
Glucose, Bld: 99 mg/dL (ref 70–99)
Potassium: 3.7 mEq/L (ref 3.5–5.1)
Sodium: 141 mEq/L (ref 135–145)
Total Bilirubin: 0.8 mg/dL (ref 0.2–1.2)
Total Protein: 7.6 g/dL (ref 6.0–8.3)

## 2019-11-05 LAB — CBC WITH DIFFERENTIAL/PLATELET
Basophils Absolute: 0.1 10*3/uL (ref 0.0–0.1)
Basophils Relative: 1 % (ref 0.0–3.0)
Eosinophils Absolute: 0.2 10*3/uL (ref 0.0–0.7)
Eosinophils Relative: 2.4 % (ref 0.0–5.0)
HCT: 48.3 % (ref 39.0–52.0)
Hemoglobin: 16.6 g/dL (ref 13.0–17.0)
Lymphocytes Relative: 32.8 % (ref 12.0–46.0)
Lymphs Abs: 2.4 10*3/uL (ref 0.7–4.0)
MCHC: 34.4 g/dL (ref 30.0–36.0)
MCV: 89.3 fl (ref 78.0–100.0)
Monocytes Absolute: 0.6 10*3/uL (ref 0.1–1.0)
Monocytes Relative: 8.3 % (ref 3.0–12.0)
Neutro Abs: 4 10*3/uL (ref 1.4–7.7)
Neutrophils Relative %: 55.5 % (ref 43.0–77.0)
Platelets: 189 10*3/uL (ref 150.0–400.0)
RBC: 5.41 Mil/uL (ref 4.22–5.81)
RDW: 13.4 % (ref 11.5–15.5)
WBC: 7.2 10*3/uL (ref 4.0–10.5)

## 2019-11-05 LAB — C-REACTIVE PROTEIN: CRP: <1 mg/dL (ref 0.5–20.0)

## 2019-11-05 NOTE — Patient Instructions (Signed)
If you are age 62 or older, your body mass index should be between 23-30. Your Body mass index is 36.96 kg/m. If this is out of the aforementioned range listed, please consider follow up with your Primary Care Provider.  If you are age 7 or younger, your body mass index should be between 19-25. Your Body mass index is 36.96 kg/m. If this is out of the aformentioned range listed, please consider follow up with your Primary Care Provider.   You have been scheduled for an abdominal ultrasound at [facility] (1st floor of hospital) on 11/12/2019 at 930am. Please arrive 15 minutes prior to your appointment for registration. Make certain not to have anything to eat or drink 6 hours prior to your appointment. Should you need to reschedule your appointment, please contact radiology at [phone]. This test typically takes about 30 minutes to perform.   Your provider has requested that you go to the basement level for lab work before leaving today. Press "B" on the elevator. The lab is located at the first door on the left as you exit the elevator.

## 2019-11-05 NOTE — Progress Notes (Signed)
11/05/2019 Lawton Dollinger [id number] 18-Apr-1956   CHIEF COMPLAINT:  RUQ pain   HISTORY OF PRESENT ILLNESS:  Randy Knapp. [place] is a very pleasant 64 year old male with a past medical history of hypertension, hyperlipidemia, GERD and BPH.  He presents today for further evaluation regarding right upper quadrant abdominal pain which started mid February 2021.  He initially assessed to his right upper quadrant discomfort was musculoskeletal.  He is now concerned his pain is possibly related to his liver.  He has difficulty describing the quality of his right upper quadrant abdominal pain.  No history of liver disease or gallstones.  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  His right upper quadrant abdominal pain is intermittent, occurs for 20 minutes then goes away.  Reaching his right arm above his head sometimes worsens this pain.  Twisting from the side or bending downward does not trigger his pain.  Eating does not trigger or changes right upper quadrant pain.  No nausea or vomiting.  Infrequent NSAID use.  Rare alcohol use.  He denies having any dysphagia or heartburn.  He is passing normal brown bowel movement daily.  Urinating normal yellow-colored urine.  His most recent colonoscopy by Dr. Ardis Hughs was done 02/27/2019 was normal.  Next colonoscopy due 02/2029.  No family history of upper GI or colorectal cancer.  No fever, sweats or chills.  No weight loss.  No other complaints today.  CBC Latest Ref Rng & Units 09/10/2018 08/28/2017 08/01/2016  WBC 4.0 - 10.5 K/uL 6.7 6.1 4.9  Hemoglobin 13.0 - 17.0 g/dL 16.4 14.7 13.9  Hematocrit 39.0 - 52.0 % 46.1 45.5 43.0  Platelets 150.0 - 400.0 K/uL 182.0 208.0 211.0   CMP Latest Ref Rng & Units 09/10/2018 08/28/2017 08/01/2016  Glucose 70 - 99 mg/dL 89 92 92  BUN 6 - 23 mg/dL 16 16 13   Creatinine 0.40 - 1.50 mg/dL 1.04 1.11 1.15  Sodium 135 - 145 mEq/L 142 142 144    Potassium 3.5 - 5.1 mEq/L 3.9 3.8 4.0  Chloride 96 - 112 mEq/L 106 106 106  CO2 19 - 32 mEq/L 29 26 31   Calcium 8.4 - 10.5 mg/dL 9.5 9.2 9.3  Total Protein 6.0 - 8.3 g/dL 6.7 7.2 7.2  Total Bilirubin 0.2 - 1.2 mg/dL 0.7 0.5 0.7  Alkaline Phos 39 - 117 U/L 68 87 79  AST 0 - 37 U/L 20 18 20   ALT 0 - 53 U/L 20 16 17      Past Medical History:  Diagnosis Date  . BPH (benign prostatic hyperplasia)   . GERD (gastroesophageal reflux disease)   . Hyperlipidemia   . Hypertension   . Prostatitis    Past Surgical History:  Procedure Laterality Date  . APPENDECTOMY  1998  . COLONOSCOPY    . ROTATOR CUFF REPAIR Left 2007    reports that he has never smoked. He has never used smokeless tobacco. He reports current alcohol use. He reports that he does not use drugs. family history includes Aneurysm in his father; Depression in his father; Gallstones in his sister. No Known Allergies    Outpatient Encounter Medications as of 11/05/2019  Medication Sig  . Cholecalciferol (VITAMIN D3) 1000 UNITS tablet Take 1,000 Units by mouth daily.    Marland Kitchen glucosamine-chondroitin 500-400 MG tablet Take 1 tablet by mouth daily.  Marland Kitchen ibuprofen (ADVIL) 200 MG tablet Take 200 mg by mouth as needed.  Marland Kitchen  losartan (COZAAR) 100 MG tablet TAKE 1 TABLET BY MOUTH EVERY DAY  . Omega 3 1200 MG CAPS Take by mouth daily.  . rosuvastatin (CRESTOR) 5 MG tablet TAKE 1 TABLET BY MOUTH EVERY DAY  . [DISCONTINUED] azithromycin (ZITHROMAX Z-PAK) 250 MG tablet As directed  . [DISCONTINUED] mometasone-formoterol (DULERA) 200-5 MCG/ACT AERO Inhale 2 puffs into the lungs 2 (two) times daily.  . [DISCONTINUED] pantoprazole (PROTONIX) 40 MG tablet Take 1 tablet (40 mg total) by mouth daily.  . [DISCONTINUED] promethazine-codeine (PHENERGAN WITH CODEINE) 6.25-10 MG/5ML syrup Take 5-10 mLs by mouth every 4 (four) hours as needed.   No facility-administered encounter medications on file as of 11/05/2019.     REVIEW OF SYSTEMS: All other  systems reviewed and negative except where noted in the History of Present Illness.   PHYSICAL EXAM: BP 110/80 (BP Location: Left Arm, Patient Position: Sitting, Cuff Size: Normal)   Pulse 64   Temp 98.9 F (37.2 C)   Ht 5' 10.75" (1.797 m) Comment: height measured without shoes  Wt 263 lb 2 oz (119.4 kg)   BMI 36.96 kg/m  General: Well developed 64 year old male in no acute distress. Head: Normocephalic and atraumatic. Eyes:  Sclerae non-icteric, conjunctive pink. Ears: Normal auditory acuity. Mouth: Dentition intact. No ulcers or lesions.  Neck: Supple.  Lungs: Clear bilaterally to auscultation without wheezes, crackles or rhonchi. Heart: Regular rate and rhythm. No murmur, rub or gallop appreciated.  Abdomen: Soft, nontender, non distended.  Anterior right rib cage palpated without tenderness.  No masses. No hepatosplenomegaly. Normoactive bowel sounds x 4 quadrants.  Rectal: Deferred. Musculoskeletal: Symmetrical with no gross deformities. Skin: Warm and dry. No rash or lesions on visible extremities. Extremities: No edema. Neurological: Alert oriented x 4, no focal deficits.  Psychological:  Alert and cooperative. Normal mood and affect.  ASSESSMENT AND PLAN:  38.  64 year old male with right upper quadrant abdominal pain -Abdominal sonogram -CBC, CMP -Patient to call office if his symptoms worsen -Further follow-up to be determined after the above labs and abdominal sonogram results received  2.  Colon cancer screening Next colonoscopy due August 2030    CC:  Plotnikov, Georgina Quint, MD

## 2019-11-06 NOTE — Progress Notes (Signed)
I agree with the above note, plan 

## 2019-11-12 ENCOUNTER — Other Ambulatory Visit: Payer: Self-pay

## 2019-11-12 ENCOUNTER — Ambulatory Visit (HOSPITAL_COMMUNITY)
Admission: RE | Admit: 2019-11-12 | Discharge: 2019-11-12 | Disposition: A | Discharge status: Home / Self Care | Payer: 59 | Source: Ambulatory Visit | Attending: Nurse Practitioner | Admitting: Nurse Practitioner

## 2019-11-12 DIAGNOSIS — R1011 Right upper quadrant pain: Secondary | ICD-10-CM | POA: Diagnosis not present

## 2019-11-20 ENCOUNTER — Other Ambulatory Visit: Payer: Self-pay

## 2019-11-20 DIAGNOSIS — R933 Abnormal findings on diagnostic imaging of other parts of digestive tract: Secondary | ICD-10-CM

## 2019-11-22 ENCOUNTER — Ambulatory Visit (HOSPITAL_COMMUNITY)
Admission: RE | Admit: 2019-11-22 | Discharge: 2019-11-22 | Disposition: A | Discharge status: Home / Self Care | Payer: 59 | Source: Ambulatory Visit | Attending: Nurse Practitioner | Admitting: Nurse Practitioner

## 2019-11-22 ENCOUNTER — Other Ambulatory Visit: Payer: Self-pay

## 2019-11-22 DIAGNOSIS — R933 Abnormal findings on diagnostic imaging of other parts of digestive tract: Secondary | ICD-10-CM | POA: Diagnosis not present

## 2019-11-22 MED ORDER — GADOBUTROL 1 MMOL/ML IV SOLN
10.0000 mL | Freq: Once | INTRAVENOUS | Status: AC | PRN
  Administered 2019-11-22: 10 mL via INTRAVENOUS

## 2019-12-21 ENCOUNTER — Other Ambulatory Visit: Payer: 59

## 2020-08-20 ENCOUNTER — Telehealth: Payer: Self-pay | Admitting: Internal Medicine

## 2020-08-20 DIAGNOSIS — Z0000 Encounter for general adult medical examination without abnormal findings: Secondary | ICD-10-CM

## 2020-08-20 NOTE — Telephone Encounter (Signed)
Once lab orders are in, please contact Mr. Catania at [phone]

## 2020-08-20 NOTE — Telephone Encounter (Signed)
Randy Knapp has an appointment scheduled on 2.21.22, he would like standard blood work ordered before the appointment so that it can be discussed at that time.  Lab orders needed

## 2020-08-21 NOTE — Telephone Encounter (Signed)
Okay.  Ordered.  Thanks

## 2020-08-21 NOTE — Telephone Encounter (Signed)
Called pt there was no answer LMOM MD ok labs. If he come here he need to call back to make lab appt, or he can go to the old lab on [address] to have done 2-3 days prior.Marland KitchenRaechel Chute

## 2020-08-21 NOTE — Addendum Note (Signed)
Addended by: Tresa Garter on: 08/21/2020 11:46 AM   Modules accepted: Orders

## 2020-08-26 ENCOUNTER — Other Ambulatory Visit: Payer: 59

## 2020-08-28 ENCOUNTER — Other Ambulatory Visit (INDEPENDENT_AMBULATORY_CARE_PROVIDER_SITE_OTHER): Payer: 59

## 2020-08-28 DIAGNOSIS — Z0000 Encounter for general adult medical examination without abnormal findings: Secondary | ICD-10-CM | POA: Diagnosis not present

## 2020-08-28 DIAGNOSIS — Z125 Encounter for screening for malignant neoplasm of prostate: Secondary | ICD-10-CM

## 2020-08-28 LAB — COMPREHENSIVE METABOLIC PANEL
ALT: 19 U/L (ref 0–53)
AST: 23 U/L (ref 0–37)
Albumin: 4.2 g/dL (ref 3.5–5.2)
Alkaline Phosphatase: 68 U/L (ref 39–117)
BUN: 19 mg/dL (ref 6–23)
CO2: 28 mEq/L (ref 19–32)
Calcium: 9.7 mg/dL (ref 8.4–10.5)
Chloride: 105 mEq/L (ref 96–112)
Creatinine, Ser: 1.25 mg/dL (ref 0.40–1.50)
GFR: 61.69 mL/min (ref >60.00)
Glucose, Bld: 79 mg/dL (ref 70–99)
Potassium: 4 mEq/L (ref 3.5–5.1)
Sodium: 143 mEq/L (ref 135–145)
Total Bilirubin: 1.8 mg/dL — ABNORMAL HIGH (ref 0.2–1.2)
Total Protein: 6.7 g/dL (ref 6.0–8.3)

## 2020-08-28 LAB — LIPID PANEL
Cholesterol: 103 mg/dL (ref 0–200)
HDL: 43.2 mg/dL (ref >39.00)
LDL Cholesterol: 44 mg/dL (ref 0–99)
NonHDL: 59.82
Total CHOL/HDL Ratio: 2
Triglycerides: 80 mg/dL (ref 0.0–149.0)
VLDL: 16 mg/dL (ref 0.0–40.0)

## 2020-08-28 LAB — CBC WITH DIFFERENTIAL/PLATELET
Basophils Absolute: 0 10*3/uL (ref 0.0–0.1)
Basophils Relative: 0.9 % (ref 0.0–3.0)
Eosinophils Absolute: 0.1 10*3/uL (ref 0.0–0.7)
Eosinophils Relative: 2.1 % (ref 0.0–5.0)
HCT: 46.4 % (ref 39.0–52.0)
Hemoglobin: 16.3 g/dL (ref 13.0–17.0)
Lymphocytes Relative: 30.2 % (ref 12.0–46.0)
Lymphs Abs: 1.6 10*3/uL (ref 0.7–4.0)
MCHC: 35.1 g/dL (ref 30.0–36.0)
MCV: 86.8 fl (ref 78.0–100.0)
Monocytes Absolute: 0.5 10*3/uL (ref 0.1–1.0)
Monocytes Relative: 8.9 % (ref 3.0–12.0)
Neutro Abs: 3.1 10*3/uL (ref 1.4–7.7)
Neutrophils Relative %: 57.9 % (ref 43.0–77.0)
Platelets: 163 10*3/uL (ref 150.0–400.0)
RBC: 5.35 Mil/uL (ref 4.22–5.81)
RDW: 13.3 % (ref 11.5–15.5)
WBC: 5.3 10*3/uL (ref 4.0–10.5)

## 2020-08-28 LAB — URINALYSIS
Hgb urine dipstick: NEGATIVE
Leukocytes,Ua: NEGATIVE
Nitrite: NEGATIVE
Specific Gravity, Urine: >=1.03 — AB (ref 1.000–1.030)
Urine Glucose: NEGATIVE
Urobilinogen, UA: 1 (ref 0.0–1.0)
pH: 5.5 (ref 5.0–8.0)

## 2020-08-28 LAB — TSH: TSH: 1.93 u[IU]/mL (ref 0.35–4.50)

## 2020-08-28 LAB — PSA: PSA: 4.87 ng/mL — ABNORMAL HIGH (ref 0.10–4.00)

## 2020-08-31 ENCOUNTER — Other Ambulatory Visit: Payer: Self-pay

## 2020-08-31 ENCOUNTER — Ambulatory Visit: Payer: 59 | Admitting: Internal Medicine

## 2020-08-31 ENCOUNTER — Encounter: Payer: Self-pay | Admitting: Internal Medicine

## 2020-08-31 DIAGNOSIS — I2510 Atherosclerotic heart disease of native coronary artery without angina pectoris: Secondary | ICD-10-CM

## 2020-08-31 DIAGNOSIS — E785 Hyperlipidemia, unspecified: Secondary | ICD-10-CM | POA: Diagnosis not present

## 2020-08-31 DIAGNOSIS — I10 Essential (primary) hypertension: Secondary | ICD-10-CM | POA: Diagnosis not present

## 2020-08-31 DIAGNOSIS — Z0000 Encounter for general adult medical examination without abnormal findings: Secondary | ICD-10-CM

## 2020-08-31 DIAGNOSIS — I2583 Coronary atherosclerosis due to lipid rich plaque: Secondary | ICD-10-CM

## 2020-08-31 MED ORDER — TRIAMCINOLONE ACETONIDE 0.1 % EX CREA: 1.0000 "application " | TOPICAL_CREAM | Freq: Two times a day (BID) | CUTANEOUS | 3 refills | Status: DC

## 2020-08-31 MED ORDER — NEOMYCIN-POLYMYXIN-HC 3.5-10000-1 OT SOLN: 3.0000 [drp] | Freq: Three times a day (TID) | OTIC | 3 refills | Status: AC

## 2020-08-31 NOTE — Assessment & Plan Note (Signed)
Losartan, NAS diet

## 2020-08-31 NOTE — Patient Instructions (Signed)
Aquaphore 

## 2020-08-31 NOTE — Assessment & Plan Note (Signed)
Crestor - low dose 

## 2020-08-31 NOTE — Assessment & Plan Note (Signed)
Coronary calcium score of 131. This was 64 nd percentile for age and sex matched control. Crestor, ASA

## 2020-08-31 NOTE — Progress Notes (Signed)
Subjective:  Patient ID: Randy Knapp, male    DOB: August 18, 1955  Age: 65 y.o. MRN: [id number]  CC: Annual Exam   HPI Randy Knapp presents for a well exam   Outpatient Medications Prior to Visit  Medication Sig Dispense Refill  . Cholecalciferol (VITAMIN D3) 1000 UNITS tablet Take 1,000 Units by mouth daily.    Marland Kitchen glucosamine-chondroitin 500-400 MG tablet Take 1 tablet by mouth daily.    Marland Kitchen ibuprofen (ADVIL) 200 MG tablet Take 200 mg by mouth as needed.    Marland Kitchen losartan (COZAAR) 100 MG tablet TAKE 1 TABLET BY MOUTH EVERY DAY 90 tablet 3  . Omega 3 1200 MG CAPS Take by mouth daily.    . rosuvastatin (CRESTOR) 5 MG tablet TAKE 1 TABLET BY MOUTH EVERY DAY 90 tablet 3   No facility-administered medications prior to visit.    ROS: Review of Systems  Constitutional: Negative for appetite change, fatigue and unexpected weight change.  HENT: Negative for congestion, nosebleeds, sneezing, sore throat and trouble swallowing.   Eyes: Negative for itching and visual disturbance.  Respiratory: Negative for cough.   Cardiovascular: Negative for chest pain, palpitations and leg swelling.  Gastrointestinal: Negative for abdominal distention, blood in stool, diarrhea and nausea.  Genitourinary: Negative for frequency and hematuria.  Musculoskeletal: Negative for back pain, gait problem, joint swelling and neck pain.  Skin: Negative for rash.  Neurological: Negative for dizziness, tremors, speech difficulty and weakness.  Psychiatric/Behavioral: Negative for agitation, dysphoric mood, sleep disturbance and suicidal ideas. The patient is not nervous/anxious.     Objective:  BP 120/82 (BP Location: Left Arm)   Pulse (!) 56   Temp 98.7 F (37.1 C) (Oral)   Ht 5' 10.5" (1.791 m)   Wt 252 lb 13.6 oz (114.7 kg)   SpO2 95%   BMI 35.77 kg/m   BP Readings from Last 3 Encounters:  08/31/20 120/82  11/05/19 110/80  02/27/19 113/73    Wt Readings from Last 3 Encounters:   08/31/20 252 lb 13.6 oz (114.7 kg)  11/05/19 263 lb 2 oz (119.4 kg)  02/27/19 250 lb (113.4 kg)    Physical Exam Constitutional:      General: He is not in acute distress.    Appearance: He is well-developed and well-nourished. He is not diaphoretic.     Comments: NAD  HENT:     Head: Normocephalic and atraumatic.     Right Ear: External ear normal.     Left Ear: External ear normal.     Nose: Nose normal.     Mouth/Throat:     Mouth: Oropharynx is clear and moist.     Pharynx: No oropharyngeal exudate.  Eyes:     General: No scleral icterus.       Right eye: No discharge.        Left eye: No discharge.     Extraocular Movements: EOM normal.     Conjunctiva/sclera: Conjunctivae normal.     Pupils: Pupils are equal, round, and reactive to light.  Neck:     Thyroid: No thyromegaly.     Vascular: No JVD.     Trachea: No tracheal deviation.  Cardiovascular:     Rate and Rhythm: Normal rate and regular rhythm.     Pulses: Intact distal pulses.     Heart sounds: Normal heart sounds. No murmur heard. No friction rub. No gallop.   Pulmonary:     Effort: Pulmonary effort is normal. No respiratory distress.  Breath sounds: Normal breath sounds. No stridor. No wheezing or rales.  Chest:     Chest wall: No tenderness.  Abdominal:     General: Bowel sounds are normal. There is no distension.     Palpations: Abdomen is soft. There is no mass.     Tenderness: There is no abdominal tenderness. There is no guarding or rebound.  Genitourinary:    Penis: Normal. No tenderness.      Prostate: Normal.     Rectum: Normal. Guaiac result negative.  Musculoskeletal:        General: No tenderness or edema. Normal range of motion.     Cervical back: Normal range of motion and neck supple.  Lymphadenopathy:     Cervical: No cervical adenopathy.  Skin:    General: Skin is warm and dry.     Coloration: Skin is not pale.     Findings: No erythema or rash.  Neurological:     Mental  Status: He is alert and oriented to person, place, and time.     Cranial Nerves: No cranial nerve deficit.     Motor: No abnormal muscle tone.     Coordination: He displays a negative Romberg sign. Coordination normal.     Gait: Gait normal.     Deep Tendon Reflexes: Reflexes are normal and symmetric. Reflexes normal.  Psychiatric:        Mood and Affect: Mood and affect normal.        Behavior: Behavior normal.        Thought Content: Thought content normal.        Judgment: Judgment normal.     Lab Results  Component Value Date   WBC 5.3 08/28/2020   HGB 16.3 08/28/2020   HCT 46.4 08/28/2020   PLT 163.0 08/28/2020   GLUCOSE 79 08/28/2020   CHOL 103 08/28/2020   TRIG 80.0 08/28/2020   HDL 43.20 08/28/2020   LDLDIRECT 92.0 08/28/2017   LDLCALC 44 08/28/2020   ALT 19 08/28/2020   AST 23 08/28/2020   NA 143 08/28/2020   K 4.0 08/28/2020   CL 105 08/28/2020   CREATININE 1.25 08/28/2020   BUN 19 08/28/2020   CO2 28 08/28/2020   TSH 1.93 08/28/2020   PSA 4.87 (H) 08/28/2020    MR LIVER W WO CONTRAST  Result Date: 11/23/2019 CLINICAL DATA:  Right upper quadrant pain EXAM: MRI ABDOMEN WITHOUT AND WITH CONTRAST TECHNIQUE: Multiplanar multisequence MR imaging of the abdomen was performed both before and after the administration of intravenous contrast. CONTRAST:  69mL GADAVIST GADOBUTROL 1 MMOL/ML IV SOLN COMPARISON:  Ultrasound dated 11/12/2019 FINDINGS: Lower chest: No acute findings. Hepatobiliary: Exam detail is mildly diminished due to motion artifact. No suspicious liver lesion identified. No focal areas of abnormal enhancement noted. Gallbladder is unremarkable. No bile duct dilatation. Pancreas:  No main duct dilatation, inflammation or mass identified. Spleen:  Within normal limits in size and appearance. Adrenals/Urinary Tract: Normal adrenal glands. Bilateral cystic kidney lesions are identified. Within the limitations of motion artifact no enhancing kidney lesions noted.  The largest kidney cyst is in the inferior pole of the left kidney measuring 1.8 cm. Cyst arising from upper pole of right kidney measures 1.5 cm. Anterior cortex left mid kidney cyst measures 1.1 cm. Additional fluid signal intensity kidney lesions also likely represent simple cysts but are too small to reliably characterize Stomach/Bowel: Visualized portions within the abdomen are unremarkable. Vascular/Lymphatic:  No aneurysm.  No abdominopelvic adenopathy Other:  No free  fluid or fluid collection Musculoskeletal: Lumbar compression deformities are identified. No suspicious bone lesion noted. IMPRESSION: 1. Mild motion degraded exam. 2. No suspicious enhancing liver lesions identified. 3. Bilateral kidney cysts. 4. Age-indeterminate lumbar compression deformities. Electronically Signed   By: Signa Kell M.D.   On: 11/23/2019 15:23    Assessment & Plan:    Sonda Primes, MD

## 2020-09-23 ENCOUNTER — Other Ambulatory Visit: Payer: Self-pay | Admitting: Internal Medicine

## 2020-10-16 ENCOUNTER — Other Ambulatory Visit: Payer: Self-pay | Admitting: Internal Medicine

## 2020-10-16 MED ORDER — PROMETHAZINE-CODEINE 6.25-10 MG/5ML PO SYRP: 5.0000 mL | ORAL_SOLUTION | ORAL | 0 refills | Status: DC | PRN

## 2020-10-16 MED ORDER — PAXLOVID 20 X 150 MG & 10 X 100MG PO TBPK: 3.0000 | ORAL_TABLET | Freq: Two times a day (BID) | ORAL | 0 refills | Status: DC

## 2020-10-19 ENCOUNTER — Other Ambulatory Visit: Payer: Self-pay | Admitting: Internal Medicine

## 2020-10-19 DIAGNOSIS — U071 COVID-19: Secondary | ICD-10-CM

## 2020-12-31 ENCOUNTER — Other Ambulatory Visit: Payer: Self-pay | Admitting: Internal Medicine

## 2021-04-26 ENCOUNTER — Encounter: Payer: Self-pay | Admitting: Internal Medicine

## 2021-04-26 ENCOUNTER — Other Ambulatory Visit: Payer: Self-pay

## 2021-04-26 ENCOUNTER — Ambulatory Visit: Payer: 59 | Admitting: Internal Medicine

## 2021-04-26 DIAGNOSIS — I10 Essential (primary) hypertension: Secondary | ICD-10-CM | POA: Diagnosis not present

## 2021-04-26 DIAGNOSIS — E785 Hyperlipidemia, unspecified: Secondary | ICD-10-CM | POA: Diagnosis not present

## 2021-04-26 DIAGNOSIS — I2583 Coronary atherosclerosis due to lipid rich plaque: Secondary | ICD-10-CM

## 2021-04-26 DIAGNOSIS — R635 Abnormal weight gain: Secondary | ICD-10-CM | POA: Insufficient documentation

## 2021-04-26 DIAGNOSIS — I2510 Atherosclerotic heart disease of native coronary artery without angina pectoris: Secondary | ICD-10-CM | POA: Diagnosis not present

## 2021-04-26 MED ORDER — PHENTERMINE HCL 37.5 MG PO TABS: 37.5000 mg | ORAL_TABLET | Freq: Every day | ORAL | 2 refills | Status: DC

## 2021-04-26 MED ORDER — ASPIRIN EC 81 MG PO TBEC: 81.0000 mg | DELAYED_RELEASE_TABLET | Freq: Every day | ORAL | 3 refills | Status: AC

## 2021-04-26 MED ORDER — SAXENDA 18 MG/3ML ~~LOC~~ SOPN: 3.0000 mg | PEN_INJECTOR | Freq: Every day | SUBCUTANEOUS | 5 refills | Status: DC

## 2021-04-26 NOTE — Progress Notes (Signed)
Subjective:  Patient ID: Randy Knapp, male    DOB: 07-10-1956  Age: 65 y.o. MRN: [id number]  CC: Flank Pain (PT SATES HE HAS A MOLE ON (L) SIDE)   HPI Randy Knapp presents for skin mole - L flank C/o wt gain Follow-up on coronary atherosclerosis, dyslipidemia, hypertension,   Outpatient Medications Prior to Visit  Medication Sig Dispense Refill   Cholecalciferol (VITAMIN D3) 1000 UNITS tablet Take 1,000 Units by mouth daily.     glucosamine-chondroitin 500-400 MG tablet Take 1 tablet by mouth daily.     ibuprofen (ADVIL) 200 MG tablet Take 200 mg by mouth as needed.     losartan (COZAAR) 100 MG tablet TAKE 1 TABLET BY MOUTH EVERY DAY 90 tablet 3   Omega 3 1200 MG CAPS Take by mouth daily.     rosuvastatin (CRESTOR) 5 MG tablet TAKE 1 TABLET BY MOUTH EVERY DAY 90 tablet 3   triamcinolone (KENALOG) 0.1 % Apply 1 application topically 2 (two) times daily. 80 g 3   Nirmatrelvir & Ritonavir (PAXLOVID) 20 x 150 MG & 10 x 100MG  TBPK Take 3 tablets by mouth in the morning and at bedtime. As directed on a package (Patient not taking: Reported on 04/26/2021) 30 tablet 0   promethazine-codeine (PHENERGAN WITH CODEINE) 6.25-10 MG/5ML syrup Take 5 mLs by mouth every 4 (four) hours as needed. (Patient not taking: Reported on 04/26/2021) 300 mL 0   No facility-administered medications prior to visit.    ROS: Review of Systems  Constitutional:  Negative for appetite change, fatigue and unexpected weight change.  HENT:  Negative for congestion, nosebleeds, sneezing, sore throat and trouble swallowing.   Eyes:  Negative for itching and visual disturbance.  Respiratory:  Negative for cough.   Cardiovascular:  Negative for chest pain, palpitations and leg swelling.  Gastrointestinal:  Negative for abdominal distention, blood in stool, diarrhea and nausea.  Genitourinary:  Negative for frequency and hematuria.  Musculoskeletal:  Negative for back pain, gait problem,  joint swelling and neck pain.  Skin:  Negative for rash.  Neurological:  Negative for dizziness, tremors, speech difficulty and weakness.  Psychiatric/Behavioral:  Negative for agitation, dysphoric mood and sleep disturbance. The patient is not nervous/anxious.    Objective:  BP 120/82 (BP Location: Left Arm)   Pulse (!) 56   Temp 97.9 F (36.6 C) (Oral)   SpO2 95%   BP Readings from Last 3 Encounters:  04/26/21 120/82  08/31/20 120/82  11/05/19 110/80    Wt Readings from Last 3 Encounters:  08/31/20 252 lb 13.6 oz (114.7 kg)  11/05/19 263 lb 2 oz (119.4 kg)  02/27/19 250 lb (113.4 kg)    Physical Exam Constitutional:      General: He is not in acute distress.    Appearance: He is well-developed.     Comments: NAD  Eyes:     Conjunctiva/sclera: Conjunctivae normal.     Pupils: Pupils are equal, round, and reactive to light.  Neck:     Thyroid: No thyromegaly.     Vascular: No JVD.  Cardiovascular:     Rate and Rhythm: Normal rate and regular rhythm.     Heart sounds: Normal heart sounds. No murmur heard.   No friction rub. No gallop.  Pulmonary:     Effort: Pulmonary effort is normal. No respiratory distress.     Breath sounds: Normal breath sounds. No wheezing or rales.  Chest:     Chest wall: No tenderness.  Abdominal:  General: Bowel sounds are normal. There is no distension.     Palpations: Abdomen is soft. There is no mass.     Tenderness: There is no abdominal tenderness. There is no guarding or rebound.  Musculoskeletal:        General: No tenderness. Normal range of motion.     Cervical back: Normal range of motion.  Lymphadenopathy:     Cervical: No cervical adenopathy.  Skin:    General: Skin is warm and dry.     Findings: No rash.  Neurological:     Mental Status: He is alert and oriented to person, place, and time.     Cranial Nerves: No cranial nerve deficit.     Motor: No abnormal muscle tone.     Coordination: Coordination normal.      Gait: Gait normal.     Deep Tendon Reflexes: Reflexes are normal and symmetric.  Psychiatric:        Behavior: Behavior normal.        Thought Content: Thought content normal.        Judgment: Judgment normal.    Lab Results  Component Value Date   WBC 5.3 08/28/2020   HGB 16.3 08/28/2020   HCT 46.4 08/28/2020   PLT 163.0 08/28/2020   GLUCOSE 79 08/28/2020   CHOL 103 08/28/2020   TRIG 80.0 08/28/2020   HDL 43.20 08/28/2020   LDLDIRECT 92.0 08/28/2017   LDLCALC 44 08/28/2020   ALT 19 08/28/2020   AST 23 08/28/2020   NA 143 08/28/2020   K 4.0 08/28/2020   CL 105 08/28/2020   CREATININE 1.25 08/28/2020   BUN 19 08/28/2020   CO2 28 08/28/2020   TSH 1.93 08/28/2020   PSA 4.87 (H) 08/28/2020    MR LIVER W WO CONTRAST  Result Date: 11/23/2019 CLINICAL DATA:  Right upper quadrant pain EXAM: MRI ABDOMEN WITHOUT AND WITH CONTRAST TECHNIQUE: Multiplanar multisequence MR imaging of the abdomen was performed both before and after the administration of intravenous contrast. CONTRAST:  19mL GADAVIST GADOBUTROL 1 MMOL/ML IV SOLN COMPARISON:  Ultrasound dated 11/12/2019 FINDINGS: Lower chest: No acute findings. Hepatobiliary: Exam detail is mildly diminished due to motion artifact. No suspicious liver lesion identified. No focal areas of abnormal enhancement noted. Gallbladder is unremarkable. No bile duct dilatation. Pancreas:  No main duct dilatation, inflammation or mass identified. Spleen:  Within normal limits in size and appearance. Adrenals/Urinary Tract: Normal adrenal glands. Bilateral cystic kidney lesions are identified. Within the limitations of motion artifact no enhancing kidney lesions noted. The largest kidney cyst is in the inferior pole of the left kidney measuring 1.8 cm. Cyst arising from upper pole of right kidney measures 1.5 cm. Anterior cortex left mid kidney cyst measures 1.1 cm. Additional fluid signal intensity kidney lesions also likely represent simple cysts but are too  small to reliably characterize Stomach/Bowel: Visualized portions within the abdomen are unremarkable. Vascular/Lymphatic:  No aneurysm.  No abdominopelvic adenopathy Other:  No free fluid or fluid collection Musculoskeletal: Lumbar compression deformities are identified. No suspicious bone lesion noted. IMPRESSION: 1. Mild motion degraded exam. 2. No suspicious enhancing liver lesions identified. 3. Bilateral kidney cysts. 4. Age-indeterminate lumbar compression deformities. Electronically Signed   By: Signa Kell M.D.   On: 11/23/2019 15:23    Assessment & Plan:   Problem List Items Addressed This Visit     Coronary atherosclerosis    CT -- Coronary arteries: Calcium noted through out the proximal and mid LAD  IMPRESSION: Coronary calcium  score of 131. This was 38 nd percentile for age and sex matched control.  Continue on Crestor, ASA.  He is wondering if he needs a stress test.  The patient has no chest pain and he is extremely active in sports.  I do not think stress test would be helpful in his situation.  I suggested a cardiology consultation instead.  He will think about it.      Relevant Medications   aspirin EC 81 MG tablet   Dyslipidemia    Continue on the low-dose Crestor      HTN (hypertension)    Continue with losartan      Relevant Medications   aspirin EC 81 MG tablet   Weight gain    Options discussed.  We can try Saxenda injections if covered.  Another option would be phentermine.  Potential benefits of a short or long term phentermine use as well as potential risks  and complications were explained to the patient and were aknowledged.          Follow-up: No follow-ups on file.  Sonda Primes, MD

## 2021-04-26 NOTE — Assessment & Plan Note (Signed)
Continue with losartan 

## 2021-04-26 NOTE — Assessment & Plan Note (Signed)
Continue on the low-dose Crestor

## 2021-04-26 NOTE — Assessment & Plan Note (Signed)
Options discussed.  We can try Saxenda injections if covered.  Another option would be phentermine.  Potential benefits of a short or long term phentermine use as well as potential risks  and complications were explained to the patient and were aknowledged.

## 2021-04-26 NOTE — Assessment & Plan Note (Addendum)
CT -- Coronary arteries: Calcium noted through out the proximal and mid LAD IMPRESSION: Coronary calcium score of 131. This was 67 nd percentile for age and sex matched control.  Continue on Crestor, ASA.  He is wondering if he needs a stress test.  The patient has no chest pain and he is extremely active in sports.  I do not think stress test would be helpful in his situation.  I suggested a cardiology consultation instead.  He will think about it.

## 2021-06-06 ENCOUNTER — Encounter: Payer: Self-pay | Admitting: Internal Medicine

## 2021-06-07 ENCOUNTER — Encounter: Payer: Self-pay | Admitting: Nurse Practitioner

## 2021-06-07 ENCOUNTER — Telehealth (INDEPENDENT_AMBULATORY_CARE_PROVIDER_SITE_OTHER): Payer: 59 | Admitting: Nurse Practitioner

## 2021-06-07 ENCOUNTER — Other Ambulatory Visit: Payer: Self-pay

## 2021-06-07 VITALS — Temp 98.6°F

## 2021-06-07 DIAGNOSIS — U071 COVID-19: Secondary | ICD-10-CM

## 2021-06-07 MED ORDER — MOLNUPIRAVIR EUA 200MG CAPSULE: 4.0000 | ORAL_CAPSULE | Freq: Two times a day (BID) | ORAL | 0 refills | Status: AC

## 2021-06-07 NOTE — Progress Notes (Signed)
Patient ID: Randy Knapp, male    DOB: 25-Feb-1956, 65 y.o.   MRN: [id number]  Virtual visit completed through [employer], a video enabled telemedicine application. Due to national recommendations of social distancing due to COVID-19, a virtual visit is felt to be most appropriate for this patient at this time. Reviewed limitations, risks, security and privacy concerns of performing a virtual visit and the availability of in person appointments. I also reviewed that there may be a patient responsible charge related to this service. The patient agreed to proceed.   Patient location: home Provider location: [place] at [facility], office Persons participating in this virtual visit: patient, provider   If any vitals were documented, they were collected by patient at home unless specified below.    Temp 98.6 F (37 C) Comment: per patient   CC: Covid 19 Subjective:   HPI: Randy Knapp is a 65 y.o. male presenting on 06/07/2021 for Covid Positive (On 06/05/21, sx started on 06/05/21-cough, fatigue, runny nose, chills. No fever or sore throat)   Symptoms started on 06/05/2021 Tested yesterday at home test on Saturday and was positive Also got a [facility] test was positive yesterday Pfizer x2 and booster No sick contacts No OTC medications thus far   Relevant past medical, surgical, family and social history reviewed and updated as indicated. Interim medical history since our last visit reviewed. Allergies and medications reviewed and updated. Outpatient Medications Prior to Visit  Medication Sig Dispense Refill   aspirin EC 81 MG tablet Take 1 tablet (81 mg total) by mouth daily. 100 tablet 3   Cholecalciferol (VITAMIN D3) 1000 UNITS tablet Take 1,000 Units by mouth daily.     glucosamine-chondroitin 500-400 MG tablet Take 1 tablet by mouth daily.     ibuprofen (ADVIL) 200 MG tablet Take 200 mg by mouth as needed.     losartan (COZAAR) 100 MG tablet TAKE 1  TABLET BY MOUTH EVERY DAY 90 tablet 3   Omega 3 1200 MG CAPS Take by mouth daily.     rosuvastatin (CRESTOR) 5 MG tablet TAKE 1 TABLET BY MOUTH EVERY DAY 90 tablet 3   triamcinolone (KENALOG) 0.1 % Apply 1 application topically 2 (two) times daily. 80 g 3   Liraglutide -Weight Management (SAXENDA) 18 MG/3ML SOPN Inject 3 mg into the skin daily. 3 mL 5   phentermine (ADIPEX-P) 37.5 MG tablet Take 1 tablet (37.5 mg total) by mouth daily before breakfast. 30 tablet 2   No facility-administered medications prior to visit.     Per HPI unless specifically indicated in ROS section below Review of Systems  Constitutional:  Positive for fatigue. Negative for chills and fever.  HENT:  Positive for rhinorrhea and sinus pressure. Negative for congestion, ear discharge, ear pain, postnasal drip and sore throat.   Respiratory:  Positive for cough. Negative for shortness of breath.   Cardiovascular:  Negative for chest pain.  Gastrointestinal:  Negative for abdominal pain, diarrhea, nausea and vomiting.  Musculoskeletal:  Negative for arthralgias and myalgias.  Neurological:  Negative for headaches.  Objective:  Temp 98.6 F (37 C) Comment: per patient  Wt Readings from Last 3 Encounters:  08/31/20 252 lb 13.6 oz (114.7 kg)  11/05/19 263 lb 2 oz (119.4 kg)  02/27/19 250 lb (113.4 kg)       Physical exam: Gen: alert, NAD, not ill appearing Pulm: speaks in complete sentences without increased work of breathing Psych: normal mood, normal thought content  Results for orders placed or performed in visit on 08/28/20  Comprehensive metabolic panel  Result Value Ref Range   Sodium 143 135 - 145 mEq/L   Potassium 4.0 3.5 - 5.1 mEq/L   Chloride 105 96 - 112 mEq/L   CO2 28 19 - 32 mEq/L   Glucose, Bld 79 70 - 99 mg/dL   BUN 19 6 - 23 mg/dL   Creatinine, Ser 8.67 0.40 - 1.50 mg/dL   Total Bilirubin 1.8 (H) 0.2 - 1.2 mg/dL   Alkaline Phosphatase 68 39 - 117 U/L   AST 23 0 - 37 U/L   ALT 19 0  - 53 U/L   Total Protein 6.7 6.0 - 8.3 g/dL   Albumin 4.2 3.5 - 5.2 g/dL   GFR 61.95 >09.32 mL/min   Calcium 9.7 8.4 - 10.5 mg/dL  PSA  Result Value Ref Range   PSA 4.87 (H) 0.10 - 4.00 ng/mL  Lipid panel  Result Value Ref Range   Cholesterol 103 0 - 200 mg/dL   Triglycerides 67.1 0.0 - 149.0 mg/dL   HDL 24.58 >09.98 mg/dL   VLDL 33.8 0.0 - 25.0 mg/dL   LDL Cholesterol 44 0 - 99 mg/dL   Total CHOL/HDL Ratio 2    NonHDL 59.82   CBC with Differential/Platelet  Result Value Ref Range   WBC 5.3 4.0 - 10.5 K/uL   RBC 5.35 4.22 - 5.81 Mil/uL   Hemoglobin 16.3 13.0 - 17.0 g/dL   HCT 53.9 76.7 - 34.1 %   MCV 86.8 78.0 - 100.0 fl   MCHC 35.1 30.0 - 36.0 g/dL   RDW 93.7 90.2 - 40.9 %   Platelets 163.0 150.0 - 400.0 K/uL   Neutrophils Relative % 57.9 43.0 - 77.0 %   Lymphocytes Relative 30.2 12.0 - 46.0 %   Monocytes Relative 8.9 3.0 - 12.0 %   Eosinophils Relative 2.1 0.0 - 5.0 %   Basophils Relative 0.9 0.0 - 3.0 %   Neutro Abs 3.1 1.4 - 7.7 K/uL   Lymphs Abs 1.6 0.7 - 4.0 K/uL   Monocytes Absolute 0.5 0.1 - 1.0 K/uL   Eosinophils Absolute 0.1 0.0 - 0.7 K/uL   Basophils Absolute 0.0 0.0 - 0.1 K/uL  Urinalysis  Result Value Ref Range   Color, Urine YELLOW Yellow;Lt. Yellow;Straw;Dark Yellow;Amber;Green;Red;Brown   APPearance CLEAR Clear;Turbid;Slightly Cloudy;Cloudy   Specific Gravity, Urine >=1.030 (A) 1.000 - 1.030   pH 5.5 5.0 - 8.0   Total Protein, Urine TRACE (A) Negative   Urine Glucose NEGATIVE Negative   Ketones, ur TRACE (A) Negative   Bilirubin Urine MODERATE (A) Negative   Hgb urine dipstick NEGATIVE Negative   Urobilinogen, UA 1.0 0.0 - 1.0   Leukocytes,Ua NEGATIVE Negative   Nitrite NEGATIVE Negative  TSH  Result Value Ref Range   TSH 1.93 0.35 - 4.50 uIU/mL   Assessment & Plan:   Problem List Items Addressed This Visit       Other   COVID-19 - Primary    Patient tested positive for COVID-19 with a home test and [facility] test.  He is within the window for  treatment we did discuss that both treatment options are still EUA.  Patient acknowledged and wanted to proceed with treatment.  Given his GFR is borderline and its been almost 10 months since last check we will opt for Molnupiravir.  Discussed common side effects in regards to this medication including uncertainty with procreation.  Patient acknowledged did discuss signs symptoms when to  be seen urgently or emergently.  Also reviewed CDC guidelines in regards to spouse and himself of quarantining. Start molnupiravir as soon as possible.  Continue to monitor.      Relevant Medications   molnupiravir EUA (LAGEVRIO) 200 mg CAPS capsule     No orders of the defined types were placed in this encounter.  No orders of the defined types were placed in this encounter.   I discussed the assessment and treatment plan with the patient. The patient was provided an opportunity to ask questions and all were answered. The patient agreed with the plan and demonstrated an understanding of the instructions. The patient was advised to call back or seek an in-person evaluation if the symptoms worsen or if the condition fails to improve as anticipated.  Follow up plan: No follow-ups on file.  Audria Nine, NP

## 2021-06-07 NOTE — Assessment & Plan Note (Signed)
Patient tested positive for COVID-19 with a home test and [facility] test.  He is within the window for treatment we did discuss that both treatment options are still EUA.  Patient acknowledged and wanted to proceed with treatment.  Given his GFR is borderline and its been almost 10 months since last check we will opt for Molnupiravir.  Discussed common side effects in regards to this medication including uncertainty with procreation.  Patient acknowledged did discuss signs symptoms when to be seen urgently or emergently.  Also reviewed CDC guidelines in regards to spouse and himself of quarantining. Start molnupiravir as soon as possible.  Continue to monitor.

## 2021-07-20 ENCOUNTER — Encounter: Payer: Self-pay | Admitting: Internal Medicine

## 2021-07-21 NOTE — Telephone Encounter (Signed)
Per msg pt is needing Prior authorization. Will forward to [place] to do PA.Marland KitchenRaechel Chute

## 2021-07-22 ENCOUNTER — Other Ambulatory Visit: Payer: Self-pay

## 2021-07-22 NOTE — Telephone Encounter (Signed)
PA started ... (Key: [id number])

## 2021-07-23 IMAGING — US US ABDOMEN COMPLETE
1 series · 13 of 25 positions shown · non-contrast
Comparison: 07/06/2007

CLINICAL DATA: RIGHT upper quadrant abdominal pain for a few
months, history GERD, hypertension, hyperlipidemia

EXAM:
ABDOMEN ULTRASOUND COMPLETE

[Series 1: us abdomen complete · 13 of 91 slices shown]
[im 1/91]
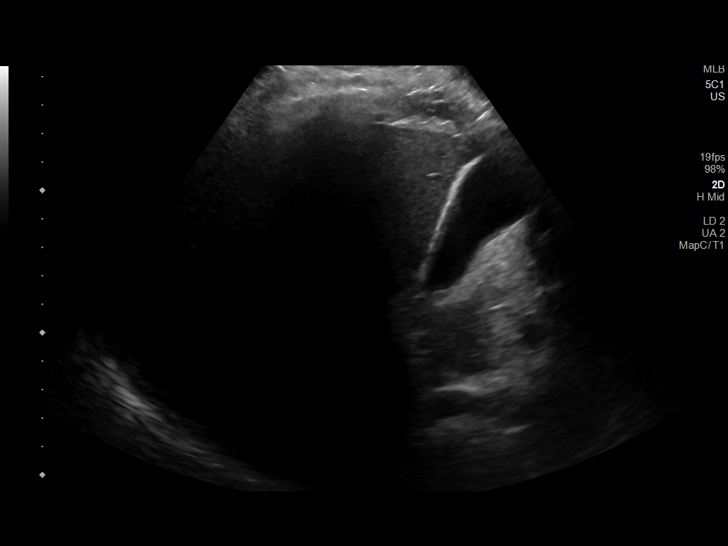
[im 8/91]
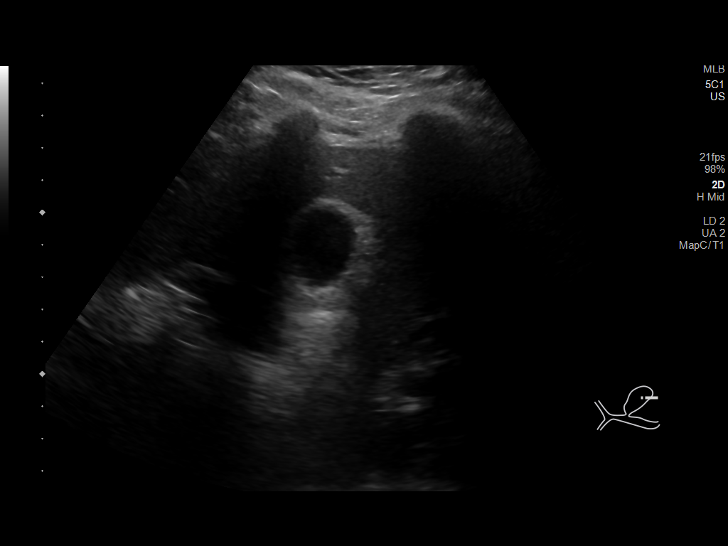
[im 16/91]
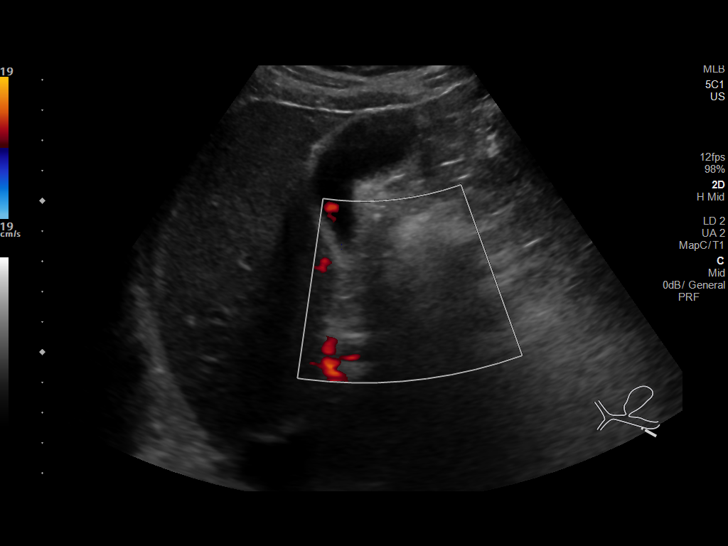
[im 23/91]
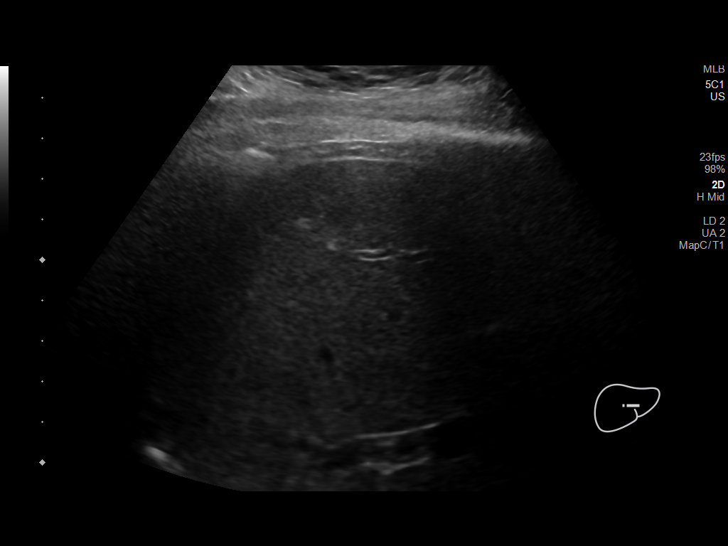
[im 31/91]
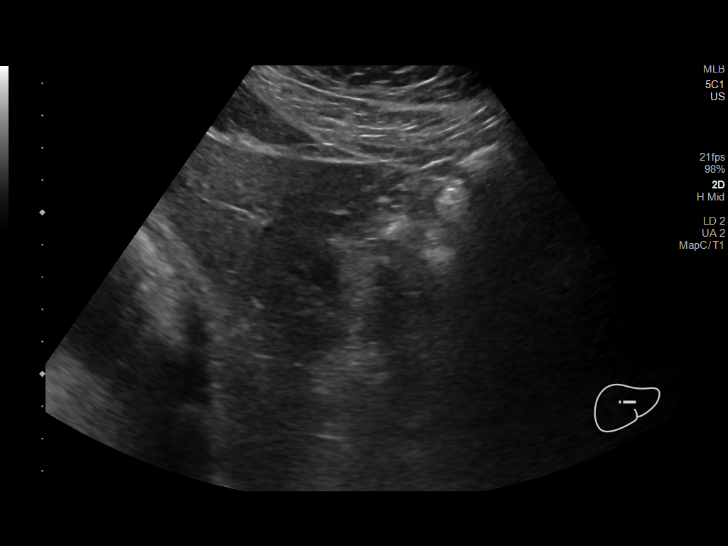
[im 38/91]
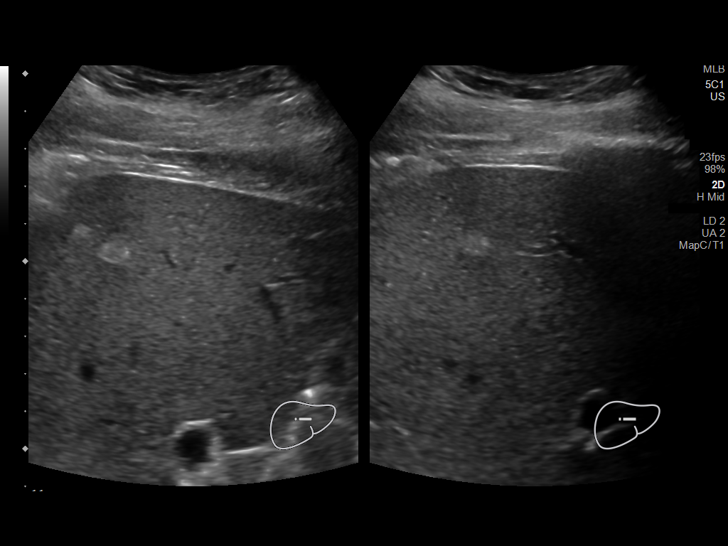
[im 46/91]
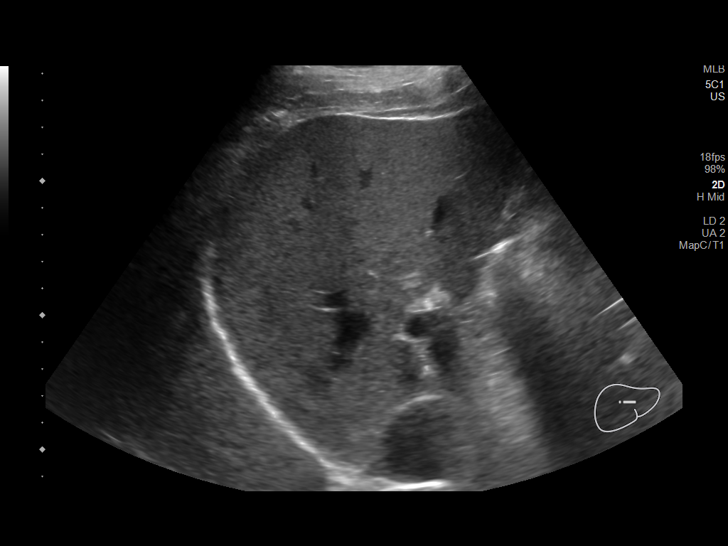
[im 53/91]
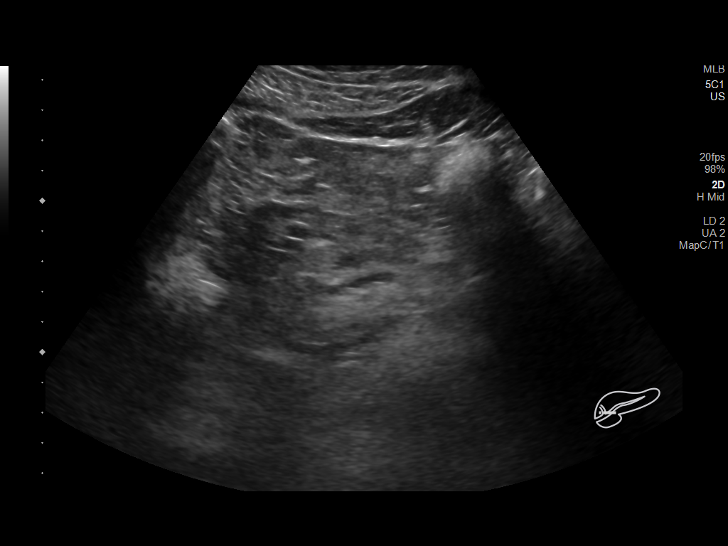
[im 61/91]
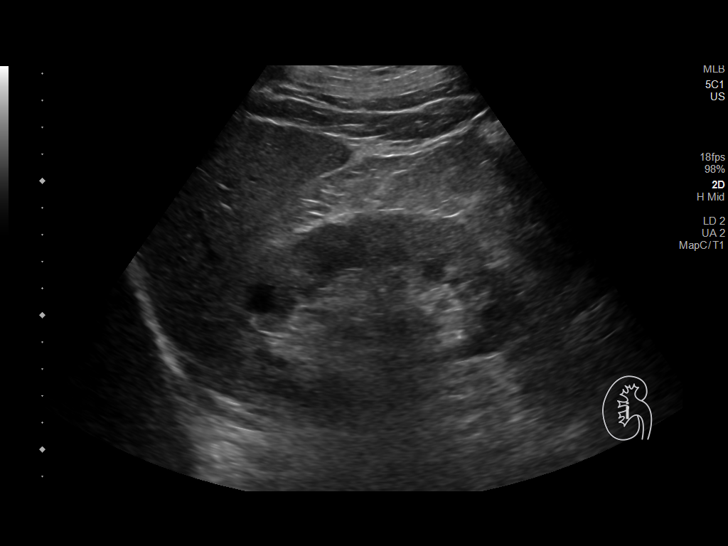
[im 68/91]
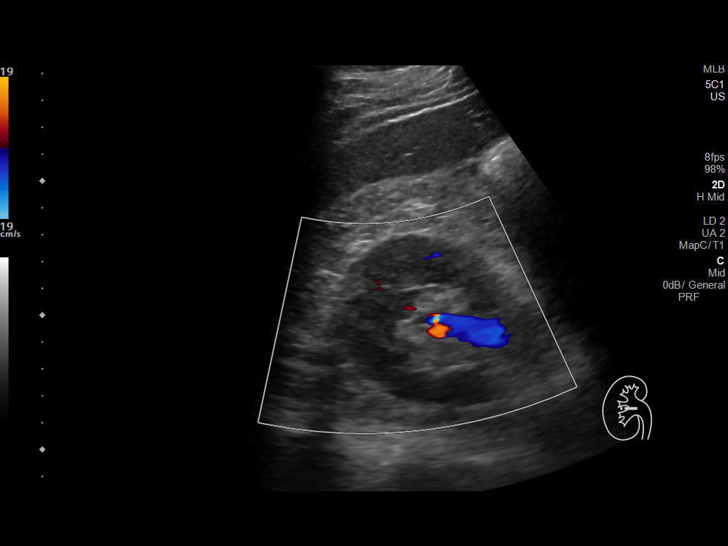
[im 76/91]
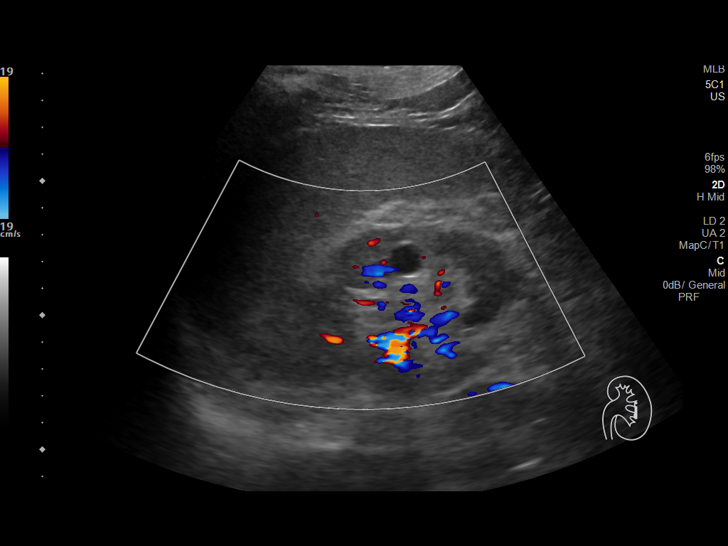
[im 83/91]
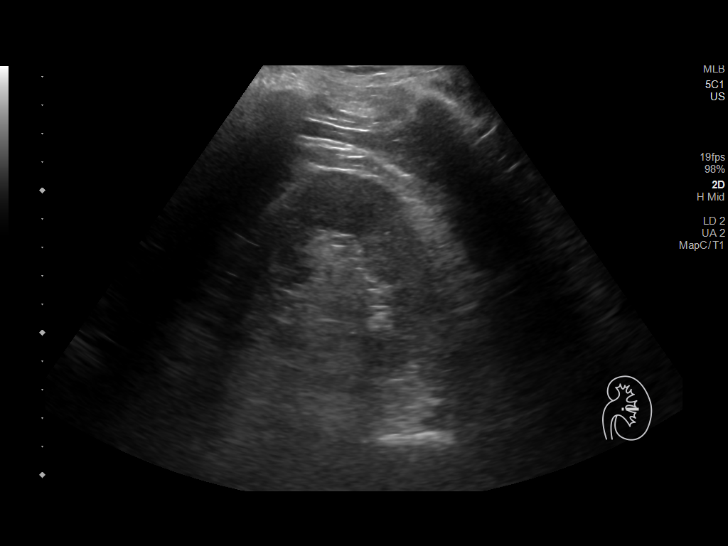
[im 91/91]
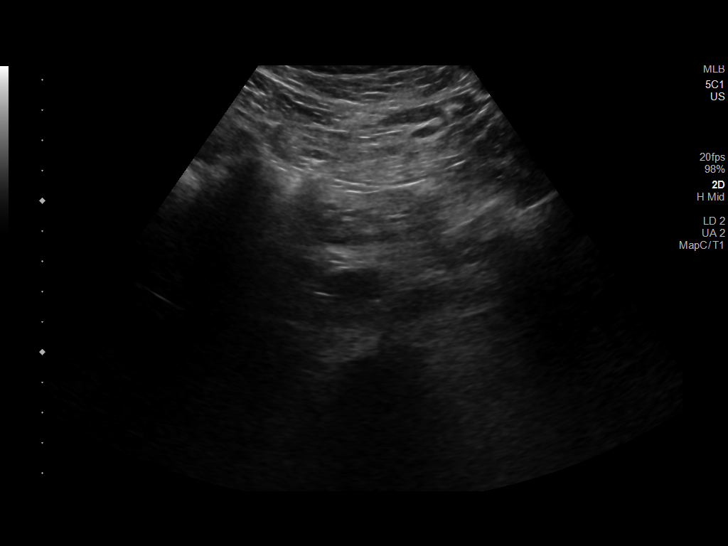

[13 of 25 positions shown; findings below may reference images not displayed]

FINDINGS: Gallbladder: Normally distended without stones or wall thickening.
No pericholecystic fluid or sonographic Murphy sign.

Common bile duct: Diameter: 4 mm, normal

Liver: Upper normal echogenicity. Two small hyperechoic nodules are
identified within the RIGHT lobe, 10 x 7 x 8 mm and 6 x 5 x 4 mm
question tiny hemangiomata. A single small hemangioma was identified
at this site on the prior study, 15 mm greatest size. No additional
masses or nodularity. Portal vein is patent on color Doppler imaging
with normal direction of blood flow towards the liver.

IVC: Normal appearance

Pancreas: Obscured due to bowel gas

Spleen: Normal appearance, 10.0 cm length

Right Kidney: Length: 12.2 cm. Normal cortical thickness. Increased
cortical echogenicity. Small cyst at upper pole 15 x 12 x 16 mm. No
additional mass or hydronephrosis

Left Kidney: Length: 11.3 cm. Normal cortical thickness. Increased
cortical echogenicity. Small cyst mid kidney 12 x 13 x 12 mm.
Additional cyst at inferior pole 21 x 19 x 20 mm containing a thin
partial septation. No solid mass or hydronephrosis

Abdominal aorta: Normal caliber

Other findings: No free fluid
IMPRESSION: Medical renal disease changes of both kidneys with small renal cysts
bilaterally.

Two small hyperechoic foci within the liver favor hemangiomata.

## 2021-07-23 NOTE — Telephone Encounter (Signed)
Your PA request has been approved.  

## 2021-08-02 IMAGING — MR MR ABDOMEN WO/W CM
19 series · 48 of 48 positions shown · IV contrast (gadavist)
Comparison: Ultrasound dated 11/12/2019

CLINICAL DATA: Right upper quadrant pain

EXAM:
MRI ABDOMEN WITHOUT AND WITH CONTRAST
TECHNIQUE: Multiplanar multisequence MR imaging of the abdomen was performed
both before and after the administration of intravenous contrast.
CONTRAST:  10mL GADAVIST GADOBUTROL 1 MMOL/ML IV SOLN

[Series 4: T2 · coronal · 6.0mm · 1.60mm/px · 2 of 43 slices shown (1 of 2)]
[im 1/43]
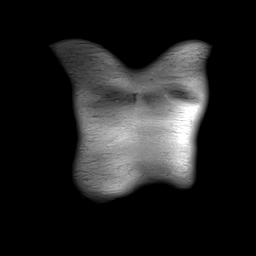
[im 43/43]
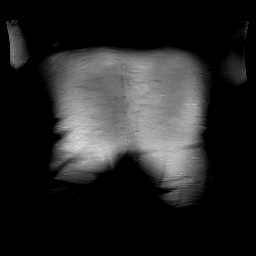

[Series 6: T2 fat-sat · axial · 6.0mm · 1.28mm/px · 1 of 41 slices shown (1 of 2)]
[im 1/41]
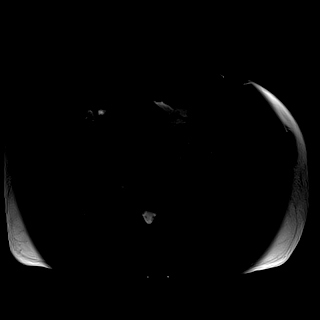

[Series 9: DWI · axial · 6.0mm · 1.53mm/px · z∈[-192,+111]mm · 3 of 85 slices shown (1 of 2)]
[im 1/85]
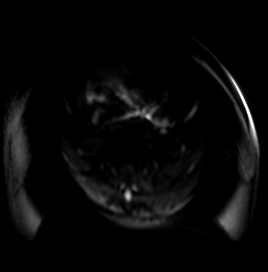
[im 43/85]
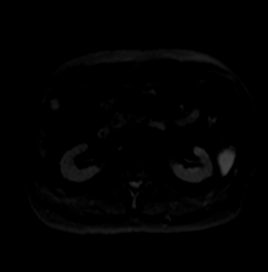
[im 85/85]
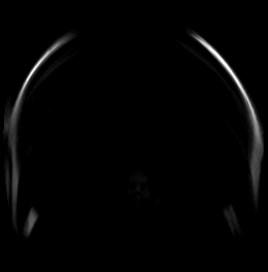

[Series 10: DWI · axial · 6.0mm · 1.53mm/px · 1 of 43 slices shown (2 of 2)]
[im 1/43]
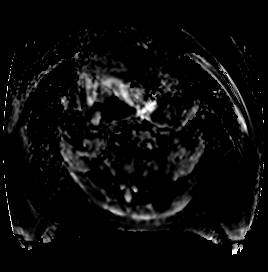

[Series 11: T1 · axial · 3.0mm · 1.28mm/px · z∈[-126,+159]mm · 3 of 96 slices shown (1 of 2)]
[im 1/96]
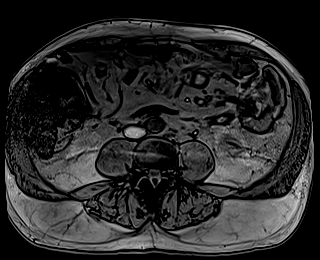
[im 48/96]
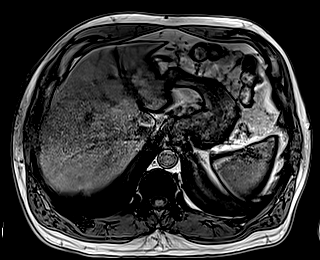
[im 96/96]
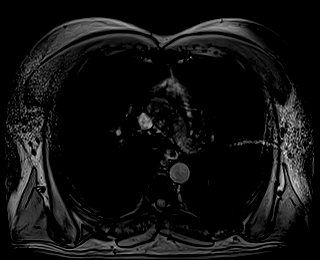

[Series 12: T1 · axial · 3.0mm · 1.28mm/px · z∈[-126,+159]mm · 3 of 96 slices shown (2 of 2)]
[im 1/96]
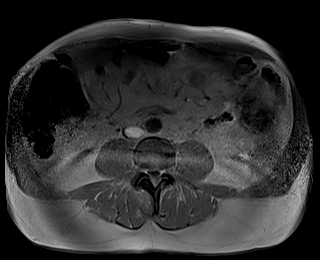
[im 48/96]
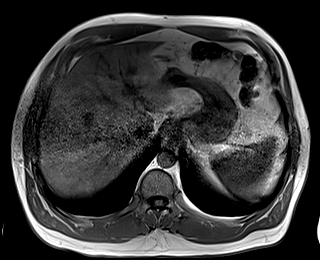
[im 96/96]
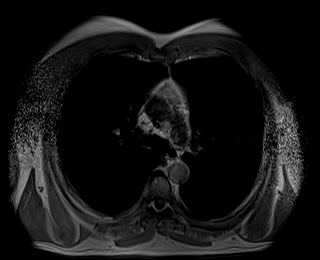

[Series 13: bSSFP · axial · 4.0mm · 0.82mm/px · z∈[-137,+147]mm · 2 of 72 slices shown]
[im 1/72]
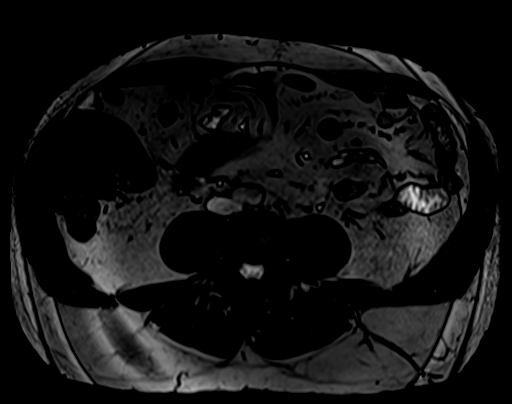
[im 72/72]
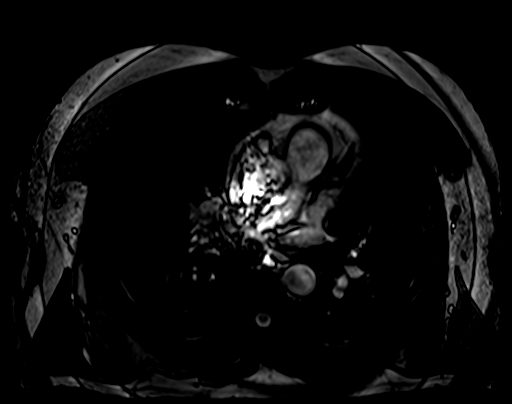

[Series 14: T2 fat-sat · axial · 5.0mm · 0.80mm/px · 1 of 27 slices shown (2 of 2)]
[im 1/27]
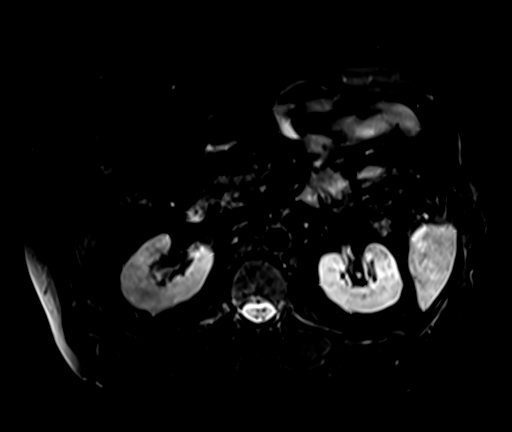

[Series 16: T1 dynamic · axial · 3.0mm · 1.28mm/px · z∈[-132,+153]mm · 3 of 96 slices shown (1 of 6)]
[im 1/96]
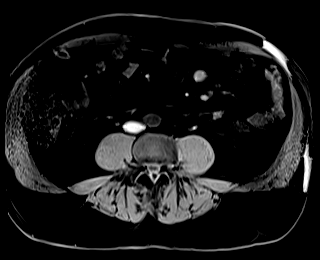
[im 48/96]
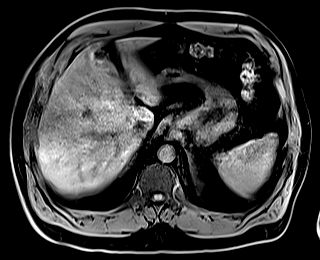
[im 96/96]
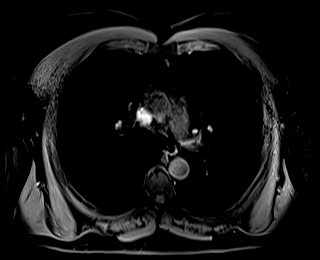

[Series 19: T1 dynamic · axial · 3.0mm · 1.28mm/px · z∈[-132,+153]mm · 3 of 96 slices shown (2 of 6)]
[im 1/96]
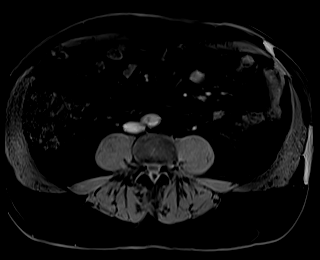
[im 48/96]
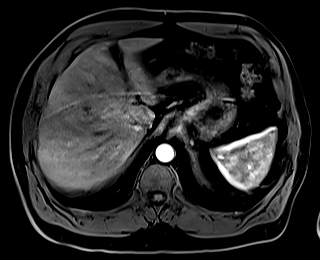
[im 96/96]
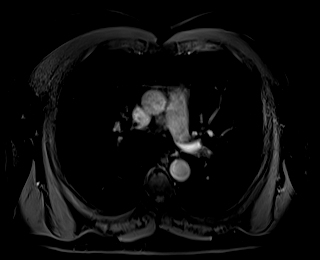

[Series 21: T1 dynamic · axial · 3.0mm · 1.28mm/px · z∈[-132,+153]mm · 3 of 96 slices shown (3 of 6)]
[im 1/96]
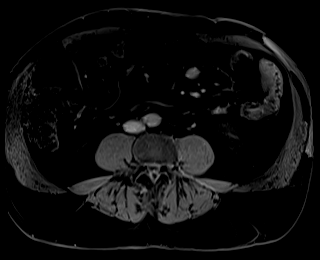
[im 48/96]
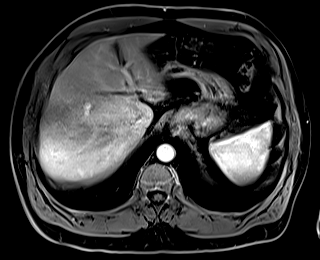
[im 96/96]
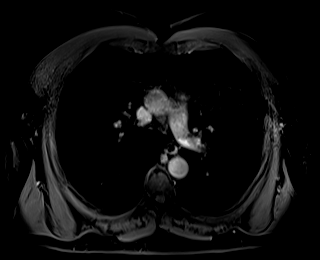

[Series 23: T1 dynamic · axial · 3.0mm · 1.28mm/px · z∈[-132,+153]mm · 3 of 96 slices shown (4 of 6)]
[im 1/96]
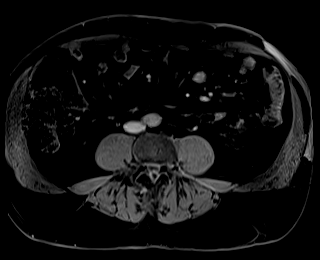
[im 48/96]
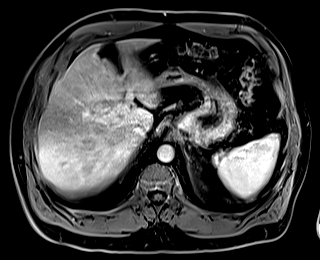
[im 96/96]
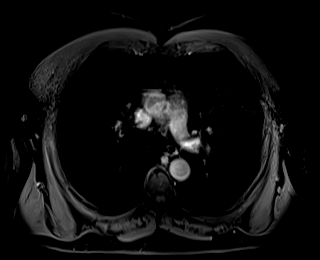

[Series 25: T1 dynamic · coronal · 3.0mm · 1.83mm/px · 4 of 112 slices shown (5 of 6)]
[im 1/112]
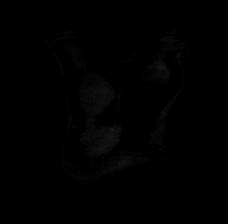
[im 38/112]
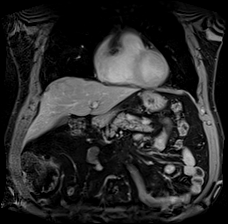
[im 75/112]
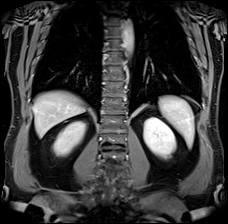
[im 112/112]
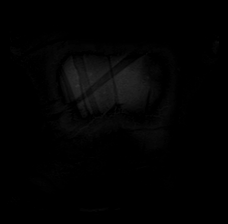

[Series 26: T2 · axial · 6.0mm · 1.60mm/px · 1 of 41 slices shown (2 of 2)]
[im 1/41]
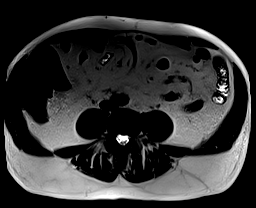

[Series 28: T1 dynamic · axial · 3.0mm · 1.28mm/px · z∈[-132,+153]mm · 3 of 96 slices shown (6 of 6)]
[im 1/96]
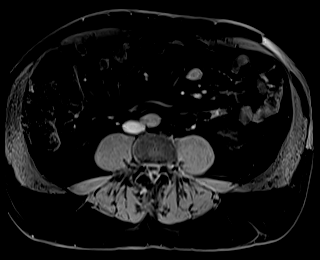
[im 48/96]
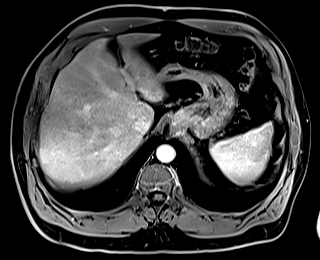
[im 96/96]
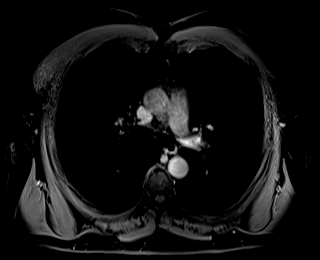

[Series 100: art sub · axial · 3.0mm · 1.28mm/px · z∈[-132,+153]mm · 3 of 96 slices shown]
[im 1/96]
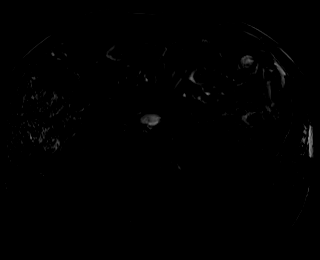
[im 48/96]
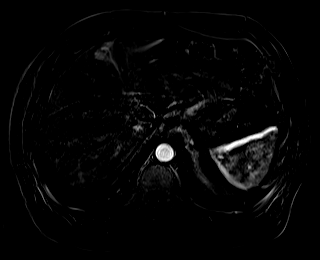
[im 96/96]
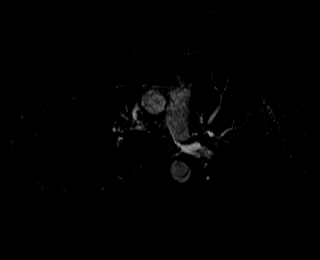

[Series 101: 45 sec sub · axial · 3.0mm · 1.28mm/px · z∈[-132,+153]mm · 3 of 96 slices shown]
[im 1/96]
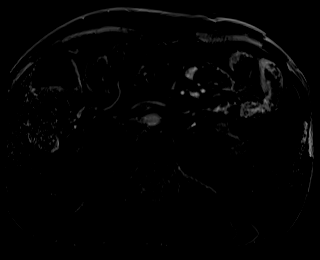
[im 48/96]
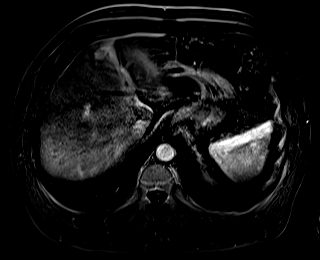
[im 96/96]
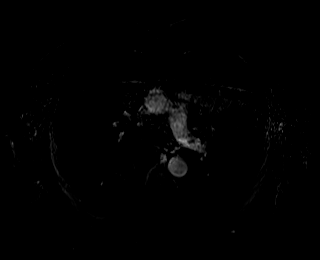

[Series 102: 90 sec sub · axial · 3.0mm · 1.28mm/px · z∈[-132,+153]mm · 3 of 96 slices shown]
[im 1/96]
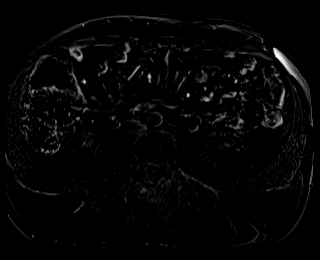
[im 48/96]
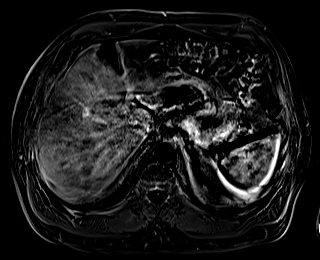
[im 96/96]
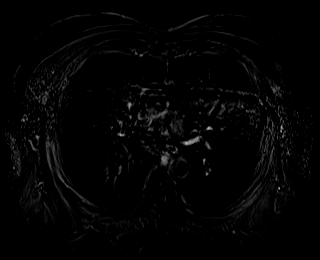

[Series 103: delay sub · axial · delayed · 3.0mm · 1.28mm/px · z∈[-132,+153]mm · 3 of 96 slices shown]
[im 1/96]
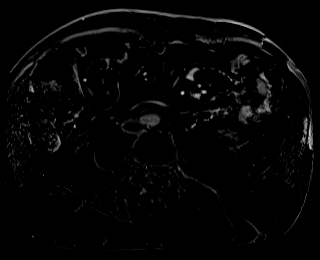
[im 48/96]
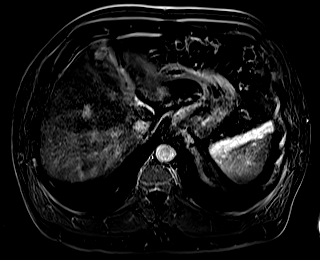
[im 96/96]
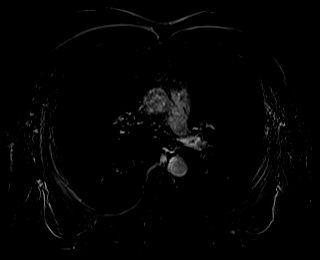

[48 of 48 positions shown; findings below may reference images not displayed]

FINDINGS: Lower chest: No acute findings.

Hepatobiliary: Exam detail is mildly diminished due to motion
artifact. No suspicious liver lesion identified. No focal areas of
abnormal enhancement noted. Gallbladder is unremarkable. No bile
duct dilatation.

Pancreas:  No main duct dilatation, inflammation or mass identified.

Spleen:  Within normal limits in size and appearance.

Adrenals/Urinary Tract: Normal adrenal glands. Bilateral cystic
kidney lesions are identified. Within the limitations of motion
artifact no enhancing kidney lesions noted. The largest kidney cyst
is in the inferior pole of the left kidney measuring 1.8 cm. Cyst
arising from upper pole of right kidney measures 1.5 cm. Anterior
cortex left mid kidney cyst measures 1.1 cm. Additional fluid signal
intensity kidney lesions also likely represent simple cysts but are
too small to reliably characterize

Stomach/Bowel: Visualized portions within the abdomen are
unremarkable.

Vascular/Lymphatic:  No aneurysm.  No abdominopelvic adenopathy

Other:  No free fluid or fluid collection

Musculoskeletal: Lumbar compression deformities are identified. No
suspicious bone lesion noted.
IMPRESSION: 1. Mild motion degraded exam.
2. No suspicious enhancing liver lesions identified.
3. Bilateral kidney cysts.
4. Age-indeterminate lumbar compression deformities.

## 2021-08-27 ENCOUNTER — Encounter: Payer: Self-pay | Admitting: Internal Medicine

## 2021-09-03 ENCOUNTER — Other Ambulatory Visit: Payer: Self-pay | Admitting: Internal Medicine

## 2021-09-03 MED ORDER — PROPRANOLOL HCL 10 MG PO TABS: 10.0000 mg | ORAL_TABLET | Freq: Two times a day (BID) | ORAL | 3 refills | Status: AC | PRN

## 2021-09-20 ENCOUNTER — Encounter: Payer: Self-pay | Admitting: Internal Medicine

## 2021-09-23 NOTE — Telephone Encounter (Signed)
Patient calling in ? ?Patient says he now has [employer] & they are requesting a PA for med Saxenda ? ?Please fu when started so patient is made aware [phone] ?

## 2021-09-24 NOTE — Telephone Encounter (Signed)
PA for Saxenda started. ? ?Key:  [id number] ?

## 2021-10-12 ENCOUNTER — Telehealth: Payer: Self-pay | Admitting: Internal Medicine

## 2021-10-12 DIAGNOSIS — Z0000 Encounter for general adult medical examination without abnormal findings: Secondary | ICD-10-CM

## 2021-10-12 NOTE — Telephone Encounter (Signed)
Pt sent a MyChart message requesting to have labs done prior to next appointment- scheduled for a physical.  ? ? ?

## 2021-10-16 NOTE — Telephone Encounter (Signed)
Okay.  Thanks.

## 2021-10-19 ENCOUNTER — Other Ambulatory Visit: Payer: Self-pay

## 2021-10-28 ENCOUNTER — Telehealth: Payer: Self-pay

## 2021-10-28 ENCOUNTER — Ambulatory Visit (INDEPENDENT_AMBULATORY_CARE_PROVIDER_SITE_OTHER): Payer: 59 | Admitting: Internal Medicine

## 2021-10-28 ENCOUNTER — Other Ambulatory Visit (INDEPENDENT_AMBULATORY_CARE_PROVIDER_SITE_OTHER): Payer: 59

## 2021-10-28 ENCOUNTER — Encounter: Payer: Self-pay | Admitting: Internal Medicine

## 2021-10-28 VITALS — BP 130/70 | Temp 98.3°F | Ht 70.5 in | Wt 239.0 lb

## 2021-10-28 DIAGNOSIS — I10 Essential (primary) hypertension: Secondary | ICD-10-CM

## 2021-10-28 DIAGNOSIS — Z125 Encounter for screening for malignant neoplasm of prostate: Secondary | ICD-10-CM

## 2021-10-28 DIAGNOSIS — R002 Palpitations: Secondary | ICD-10-CM

## 2021-10-28 DIAGNOSIS — R635 Abnormal weight gain: Secondary | ICD-10-CM

## 2021-10-28 DIAGNOSIS — Z0000 Encounter for general adult medical examination without abnormal findings: Secondary | ICD-10-CM

## 2021-10-28 DIAGNOSIS — I2510 Atherosclerotic heart disease of native coronary artery without angina pectoris: Secondary | ICD-10-CM

## 2021-10-28 LAB — COMPREHENSIVE METABOLIC PANEL
ALT: 22 U/L (ref 0–53)
AST: 19 U/L (ref 0–37)
Albumin: 4.2 g/dL (ref 3.5–5.2)
Alkaline Phosphatase: 58 U/L (ref 39–117)
BUN: 21 mg/dL (ref 6–23)
CO2: 31 mEq/L (ref 19–32)
Calcium: 9.3 mg/dL (ref 8.4–10.5)
Chloride: 104 mEq/L (ref 96–112)
Creatinine, Ser: 1.3 mg/dL (ref 0.40–1.50)
GFR: 58.37 mL/min — ABNORMAL LOW (ref >60.00)
Glucose, Bld: 89 mg/dL (ref 70–99)
Potassium: 4 mEq/L (ref 3.5–5.1)
Sodium: 143 mEq/L (ref 135–145)
Total Bilirubin: 0.9 mg/dL (ref 0.2–1.2)
Total Protein: 6.8 g/dL (ref 6.0–8.3)

## 2021-10-28 LAB — URINALYSIS, ROUTINE W REFLEX MICROSCOPIC
Hgb urine dipstick: NEGATIVE
Ketones, ur: NEGATIVE
Nitrite: NEGATIVE
RBC / HPF: NONE SEEN (ref 0–?)
Specific Gravity, Urine: >=1.03 — AB (ref 1.000–1.030)
Total Protein, Urine: NEGATIVE
Urine Glucose: NEGATIVE
Urobilinogen, UA: 0.2 (ref 0.0–1.0)
pH: 5.5 (ref 5.0–8.0)

## 2021-10-28 LAB — LIPID PANEL
Cholesterol: 134 mg/dL (ref 0–200)
HDL: 41.2 mg/dL (ref >39.00)
LDL Cholesterol: 74 mg/dL (ref 0–99)
NonHDL: 92.44
Total CHOL/HDL Ratio: 3
Triglycerides: 91 mg/dL (ref 0.0–149.0)
VLDL: 18.2 mg/dL (ref 0.0–40.0)

## 2021-10-28 LAB — CBC WITH DIFFERENTIAL/PLATELET
Basophils Absolute: 0 10*3/uL (ref 0.0–0.1)
Basophils Relative: 0.9 % (ref 0.0–3.0)
Eosinophils Absolute: 0.1 10*3/uL (ref 0.0–0.7)
Eosinophils Relative: 2.6 % (ref 0.0–5.0)
HCT: 44.7 % (ref 39.0–52.0)
Hemoglobin: 15.2 g/dL (ref 13.0–17.0)
Lymphocytes Relative: 34.7 % (ref 12.0–46.0)
Lymphs Abs: 1.7 10*3/uL (ref 0.7–4.0)
MCHC: 34.1 g/dL (ref 30.0–36.0)
MCV: 87.5 fl (ref 78.0–100.0)
Monocytes Absolute: 0.4 10*3/uL (ref 0.1–1.0)
Monocytes Relative: 8.3 % (ref 3.0–12.0)
Neutro Abs: 2.6 10*3/uL (ref 1.4–7.7)
Neutrophils Relative %: 53.5 % (ref 43.0–77.0)
Platelets: 185 10*3/uL (ref 150.0–400.0)
RBC: 5.11 Mil/uL (ref 4.22–5.81)
RDW: 13 % (ref 11.5–15.5)
WBC: 4.9 10*3/uL (ref 4.0–10.5)

## 2021-10-28 LAB — PSA: PSA: 7.23 ng/mL — ABNORMAL HIGH (ref 0.10–4.00)

## 2021-10-28 LAB — TSH: TSH: 2.34 u[IU]/mL (ref 0.35–5.50)

## 2021-10-28 MED ORDER — SAXENDA 18 MG/3ML ~~LOC~~ SOPN: 3.0000 mg | PEN_INJECTOR | Freq: Every day | SUBCUTANEOUS | 5 refills | Status: DC

## 2021-10-28 MED ORDER — PROPRANOLOL HCL ER 60 MG PO CP24: 60.0000 mg | ORAL_CAPSULE | Freq: Every day | ORAL | 3 refills | Status: DC

## 2021-10-28 NOTE — Assessment & Plan Note (Addendum)
Start Propranolol LA daily ?Propranolol 10 mg prn ?

## 2021-10-28 NOTE — Progress Notes (Signed)
? ?Subjective:  ?Patient ID: Randy Knapp, male    DOB: Mar 07, 1956  Age: 66 y.o. MRN: [id number] ? ?CC: No chief complaint on file. ? ? ?HPI ?Randy Knapp presents for a well exam ? ?Outpatient Medications Prior to Visit  ?Medication Sig Dispense Refill  ? aspirin EC 81 MG tablet Take 1 tablet (81 mg total) by mouth daily. 100 tablet 3  ? Cholecalciferol (VITAMIN D3) 1000 UNITS tablet Take 1,000 Units by mouth daily.    ? FLOWFLEX COVID-19 AG HOME TEST KIT See admin instructions.    ? glucosamine-chondroitin 500-400 MG tablet Take 1 tablet by mouth daily.    ? ibuprofen (ADVIL) 200 MG tablet Take 200 mg by mouth as needed.    ? losartan (COZAAR) 100 MG tablet TAKE 1 TABLET BY MOUTH EVERY DAY 90 tablet 3  ? Omega 3 1200 MG CAPS Take by mouth daily.    ? propranolol (INDERAL) 10 MG tablet Take 1 tablet (10 mg total) by mouth 2 (two) times daily as needed. For palpitations 60 tablet 3  ? rosuvastatin (CRESTOR) 5 MG tablet TAKE 1 TABLET BY MOUTH EVERY DAY 90 tablet 3  ? SAXENDA 18 MG/3ML SOPN Inject 3 mg into the skin daily.    ? ?No facility-administered medications prior to visit.  ? ? ?ROS: ?Review of Systems  ?Constitutional:  Negative for appetite change, fatigue and unexpected weight change.  ?HENT:  Negative for congestion, nosebleeds, sneezing, sore throat and trouble swallowing.   ?Eyes:  Negative for itching and visual disturbance.  ?Respiratory:  Negative for cough.   ?Cardiovascular:  Negative for chest pain, palpitations and leg swelling.  ?Gastrointestinal:  Negative for abdominal distention, blood in stool, diarrhea and nausea.  ?Genitourinary:  Negative for frequency and hematuria.  ?Musculoskeletal:  Negative for back pain, gait problem, joint swelling and neck pain.  ?Skin:  Negative for rash.  ?Neurological:  Negative for dizziness, tremors, speech difficulty and weakness.  ?Psychiatric/Behavioral:  Negative for agitation, dysphoric mood and sleep disturbance. The patient is  not nervous/anxious.   ? ?Objective:  ?BP 130/70 (BP Location: Left Arm, Patient Position: Sitting, Cuff Size: Large)   Temp 98.3 ?F (36.8 ?C) (Oral)   Ht 5' 10.5" (1.791 m)   Wt 239 lb (108.4 kg)   BMI 33.81 kg/m?  ? ?BP Readings from Last 3 Encounters:  ?10/28/21 130/70  ?04/26/21 120/82  ?08/31/20 120/82  ? ? ?Wt Readings from Last 3 Encounters:  ?10/28/21 239 lb (108.4 kg)  ?08/31/20 252 lb 13.6 oz (114.7 kg)  ?11/05/19 263 lb 2 oz (119.4 kg)  ? ? ?Physical Exam ?Constitutional:   ?   General: He is not in acute distress. ?   Appearance: He is well-developed.  ?   Comments: NAD  ?Eyes:  ?   Conjunctiva/sclera: Conjunctivae normal.  ?   Pupils: Pupils are equal, round, and reactive to light.  ?Neck:  ?   Thyroid: No thyromegaly.  ?   Vascular: No JVD.  ?Cardiovascular:  ?   Rate and Rhythm: Normal rate and regular rhythm.  ?   Heart sounds: Normal heart sounds. No murmur heard. ?  No friction rub. No gallop.  ?Pulmonary:  ?   Effort: Pulmonary effort is normal. No respiratory distress.  ?   Breath sounds: Normal breath sounds. No wheezing or rales.  ?Chest:  ?   Chest wall: No tenderness.  ?Abdominal:  ?   General: Bowel sounds are normal. There is no distension.  ?  Palpations: Abdomen is soft. There is no mass.  ?   Tenderness: There is no abdominal tenderness. There is no guarding or rebound.  ?Musculoskeletal:     ?   General: No tenderness. Normal range of motion.  ?   Cervical back: Normal range of motion.  ?Lymphadenopathy:  ?   Cervical: No cervical adenopathy.  ?Skin: ?   General: Skin is warm and dry.  ?   Findings: No rash.  ?Neurological:  ?   Mental Status: He is alert and oriented to person, place, and time.  ?   Cranial Nerves: No cranial nerve deficit.  ?   Motor: No abnormal muscle tone.  ?   Coordination: Coordination normal.  ?   Gait: Gait normal.  ?   Deep Tendon Reflexes: Reflexes are normal and symmetric.  ?Psychiatric:     ?   Behavior: Behavior normal.     ?   Thought Content:  Thought content normal.     ?   Judgment: Judgment normal.  ?Rectal - per Urology ? ?Lab Results  ?Component Value Date  ? WBC 5.3 08/28/2020  ? HGB 16.3 08/28/2020  ? HCT 46.4 08/28/2020  ? PLT 163.0 08/28/2020  ? GLUCOSE 79 08/28/2020  ? CHOL 103 08/28/2020  ? TRIG 80.0 08/28/2020  ? HDL 43.20 08/28/2020  ? LDLDIRECT 92.0 08/28/2017  ? LDLCALC 44 08/28/2020  ? ALT 19 08/28/2020  ? AST 23 08/28/2020  ? NA 143 08/28/2020  ? K 4.0 08/28/2020  ? CL 105 08/28/2020  ? CREATININE 1.25 08/28/2020  ? BUN 19 08/28/2020  ? CO2 28 08/28/2020  ? TSH 1.93 08/28/2020  ? PSA 4.87 (H) 08/28/2020  ? ? ?MR LIVER W WO CONTRAST ? ?Result Date: 11/23/2019 ?CLINICAL DATA:  Right upper quadrant pain EXAM: MRI ABDOMEN WITHOUT AND WITH CONTRAST TECHNIQUE: Multiplanar multisequence MR imaging of the abdomen was performed both before and after the administration of intravenous contrast. CONTRAST:  76m GADAVIST GADOBUTROL 1 MMOL/ML IV SOLN COMPARISON:  Ultrasound dated 11/12/2019 FINDINGS: Lower chest: No acute findings. Hepatobiliary: Exam detail is mildly diminished due to motion artifact. No suspicious liver lesion identified. No focal areas of abnormal enhancement noted. Gallbladder is unremarkable. No bile duct dilatation. Pancreas:  No main duct dilatation, inflammation or mass identified. Spleen:  Within normal limits in size and appearance. Adrenals/Urinary Tract: Normal adrenal glands. Bilateral cystic kidney lesions are identified. Within the limitations of motion artifact no enhancing kidney lesions noted. The largest kidney cyst is in the inferior pole of the left kidney measuring 1.8 cm. Cyst arising from upper pole of right kidney measures 1.5 cm. Anterior cortex left mid kidney cyst measures 1.1 cm. Additional fluid signal intensity kidney lesions also likely represent simple cysts but are too small to reliably characterize Stomach/Bowel: Visualized portions within the abdomen are unremarkable. Vascular/Lymphatic:  No aneurysm.   No abdominopelvic adenopathy Other:  No free fluid or fluid collection Musculoskeletal: Lumbar compression deformities are identified. No suspicious bone lesion noted. IMPRESSION: 1. Mild motion degraded exam. 2. No suspicious enhancing liver lesions identified. 3. Bilateral kidney cysts. 4. Age-indeterminate lumbar compression deformities. Electronically Signed   By: TKerby MoorsM.D.   On: 11/23/2019 15:23  ? ? ?Assessment & Plan:  ? ?Problem List Items Addressed This Visit   ? ? Well adult exam  ?  We discussed age appropriate health related issues, including available/recomended screening tests and vaccinations. We discussed a need for adhering to healthy diet and exercise. Labs/EKG were  reviewed/ordered. All questions were answered. ? ?Last colon --  2020 ?  ?  ? Coronary atherosclerosis  ?  On crestor ? ?  ?  ? Relevant Medications  ? propranolol ER (INDERAL LA) 60 MG 24 hr capsule  ? Weight gain  ?  Pt lost wt on Saxenda ? ?  ?  ?  ? ? ?Meds ordered this encounter  ?Medications  ? SAXENDA 18 MG/3ML SOPN  ?  Sig: Inject 3 mg into the skin daily.  ?  Dispense:  3 mL  ?  Refill:  5  ? propranolol ER (INDERAL LA) 60 MG 24 hr capsule  ?  Sig: Take 1 capsule (60 mg total) by mouth daily.  ?  Dispense:  90 capsule  ?  Refill:  3  ?  ? ? ?Follow-up: Return in about 6 months (around 04/29/2022) for a follow-up visit. ? ?Walker Kehr, MD ?

## 2021-10-28 NOTE — Telephone Encounter (Signed)
PA for [facility] has been started with pts new insurance. ? ?Key: [id number] ?

## 2021-10-28 NOTE — Assessment & Plan Note (Signed)
Pt lost wt on Saxenda ?

## 2021-10-28 NOTE — Assessment & Plan Note (Signed)
On crestor.   

## 2021-10-28 NOTE — Assessment & Plan Note (Signed)
We discussed age appropriate health related issues, including available/recomended screening tests and vaccinations. We discussed a need for adhering to healthy diet and exercise. Labs/EKG were reviewed/ordered. All questions were answered. ? ?Last colon --  2020 ?

## 2021-10-28 NOTE — Assessment & Plan Note (Signed)
Start Propranolol LA daily ?Propranolol 10 mg prn ?

## 2021-10-29 NOTE — Telephone Encounter (Signed)
Rec'd determination letter med was '"denied" it states medication is not on pt formulary..../lmb ?

## 2021-11-01 ENCOUNTER — Encounter: Payer: Self-pay | Admitting: Internal Medicine

## 2021-11-01 NOTE — Telephone Encounter (Signed)
Rec'd msg off cover-my-meds insurance denied coverage for Saxenda it states " Your request for prior authorization has been denied. To initiate appeal, please complete this form and 'send to plan' to fax the form to the insurance plan." Sent pt a msg w/ status and to contact insurance to see wht they will cover and let MD know.Marland KitchenJohny Chess ?

## 2021-11-19 ENCOUNTER — Other Ambulatory Visit: Payer: Self-pay | Admitting: Internal Medicine

## 2021-12-08 ENCOUNTER — Encounter: Payer: Self-pay | Admitting: Internal Medicine

## 2021-12-13 ENCOUNTER — Other Ambulatory Visit: Payer: Self-pay | Admitting: Internal Medicine

## 2021-12-13 DIAGNOSIS — R972 Elevated prostate specific antigen [PSA]: Secondary | ICD-10-CM

## 2021-12-22 ENCOUNTER — Encounter: Payer: Self-pay | Admitting: Internal Medicine

## 2022-01-10 ENCOUNTER — Ambulatory Visit: Payer: Self-pay | Admitting: Urology

## 2022-02-03 ENCOUNTER — Ambulatory Visit: Payer: Self-pay | Admitting: Urology

## 2022-02-23 ENCOUNTER — Ambulatory Visit: Payer: 59 | Admitting: Urology

## 2022-02-23 ENCOUNTER — Encounter: Payer: Self-pay | Admitting: Urology

## 2022-02-23 VITALS — BP 143/84 | HR 63 | Ht 72.0 in | Wt 240.0 lb

## 2022-02-23 DIAGNOSIS — Z125 Encounter for screening for malignant neoplasm of prostate: Secondary | ICD-10-CM | POA: Diagnosis not present

## 2022-02-23 DIAGNOSIS — R972 Elevated prostate specific antigen [PSA]: Secondary | ICD-10-CM

## 2022-02-23 NOTE — Patient Instructions (Signed)

## 2022-02-23 NOTE — Progress Notes (Signed)
   02/23/22 3:07 PM   Randy Knapp Dense Sep 24, 1955 [id number]  CC: Elevated PSA  HPI: 66 year old male with long history of elevated PSA.  He was previously followed by both Dr. Pete Glatter and Dr. Laverle Patter, and extensive chart review was performed.  He underwent a prostate biopsy in 2013 for an ablated PSA of 4.5 which showed a few atypical cells and a repeat biopsy was benign.  PSA then rose to 7 in 2017 and he underwent an MRI with Dr. Laverle Patter which showed a 90 g prostate but no suspicious lesions.  PSA has been stable since that time ranging from 6 to 9 with very reassuring percentage free of 30 to 40%.  Most recent PSA again is stable at 7.23.  DRE's have always been benign.  He denies any significant urinary symptoms, nothing bothersome enough to consider medications.  He denies any problems with erections.  Denies gross hematuria   PMH: Past Medical History:  Diagnosis Date   BPH (benign prostatic hyperplasia)    GERD (gastroesophageal reflux disease)    Hyperlipidemia    Hypertension    Prostatitis     Surgical History: Past Surgical History:  Procedure Laterality Date   APPENDECTOMY  1998   COLONOSCOPY     ROTATOR CUFF REPAIR Left 2007    Family History: Family History  Problem Relation Age of Onset   Aneurysm Father    Depression Father        suicide   Gallstones Sister    Coronary artery disease Neg Hx     Social History:  reports that he has never smoked. He has never been exposed to tobacco smoke. He has never used smokeless tobacco. He reports current alcohol use. He reports that he does not use drugs.  Physical Exam: BP (!) 143/84   Pulse 63   Ht 6' (1.829 m)   Wt 240 lb (108.9 kg)   BMI 32.55 kg/m    Constitutional:  Alert and oriented, No acute distress. Cardiovascular: No clubbing, cyanosis, or edema. Respiratory: Normal respiratory effort, no increased work of breathing. GI: Abdomen is soft, nontender, nondistended, no abdominal  masses  Imaging: I personally viewed and interpreted the MRI from 2017 showing no suspicious lesions and volume of 90 g  Assessment & Plan:   66 year old male with a very long history of elevated PSA, which has been stable over the last few years.  Most recent PSA of 7.23 stable, and actually decreased from 9 last year with reassuring 40% free.  Negative biopsy x2 in 2013, negative prostate MRI in 2017 with no suspicious lesions, volume of 90 g, and reassuring PSA density of 0.08.  Extensive outside chart review of Dr. Pete Glatter and Dr. Vevelyn Royals notes performed.  Reassurance was provided that PSA most likely secondary to enlarged prostate, and I would not recommend any further aggressive work-up at this time with prostate MRI or biopsy with the stability of the PSA, elevated percentage free, low PSA density.  Would only consider more aggressive work-up or prostate MRI if PSA rises greater than 10 on two values.  Follow-up with urology as needed, can continue yearly PSA with PCP through age 89, would refer to urology if has 2 values of >10  Legrand Rams, MD 02/23/2022  [facility] [address], [address] [place], [place] [address] [phone]

## 2022-02-25 ENCOUNTER — Other Ambulatory Visit: Payer: Self-pay | Admitting: Internal Medicine

## 2022-03-21 ENCOUNTER — Encounter: Payer: Self-pay | Admitting: Internal Medicine

## 2022-03-22 MED ORDER — SAXENDA 18 MG/3ML ~~LOC~~ SOPN
3.0000 mg | PEN_INJECTOR | Freq: Every day | SUBCUTANEOUS | 5 refills | Status: DC
Start: 2022-03-22 — End: 2024-05-14

## 2022-03-29 NOTE — Telephone Encounter (Signed)
Submitted PA to cover-my-meds w/(Key: [id number]). PA sent to [facility].Marland KitchenJohny Chess

## 2022-03-31 NOTE — Telephone Encounter (Signed)
Cover-my-meds cancelled PA for Saxenda. Since pt has new insurance [employer] was able to call [phone]. Instructed to go to website [email or web address] will have download form and completed then fax back. Completed form fax back to [phone]

## 2022-04-01 NOTE — Telephone Encounter (Signed)
Tried to fax to PA to [phone] # no longer in service . Was able to do PA online sent info just waiting to hear back from insurance.Marland KitchenChryl Heck

## 2022-04-04 NOTE — Telephone Encounter (Signed)
Rec'd determination med was DENIED. No alternative was given../lmb 

## 2022-04-04 NOTE — Telephone Encounter (Signed)
Rec'd fax from [employer] needing more information fax. Faxed last OV notes and labs to [phone].Marland KitchenJohny Chess

## 2022-09-12 ENCOUNTER — Encounter: Payer: Self-pay | Admitting: Internal Medicine

## 2022-11-01 ENCOUNTER — Ambulatory Visit: Payer: BC Managed Care – PPO | Admitting: Internal Medicine

## 2022-11-01 ENCOUNTER — Encounter: Payer: Self-pay | Admitting: Internal Medicine

## 2022-11-01 VITALS — BP 100/80 | HR 57 | Temp 98.1°F | Ht 72.0 in | Wt 256.0 lb

## 2022-11-01 DIAGNOSIS — I10 Essential (primary) hypertension: Secondary | ICD-10-CM

## 2022-11-01 DIAGNOSIS — E785 Hyperlipidemia, unspecified: Secondary | ICD-10-CM

## 2022-11-01 DIAGNOSIS — Z0000 Encounter for general adult medical examination without abnormal findings: Secondary | ICD-10-CM

## 2022-11-01 DIAGNOSIS — R972 Elevated prostate specific antigen [PSA]: Secondary | ICD-10-CM | POA: Diagnosis not present

## 2022-11-01 DIAGNOSIS — G8929 Other chronic pain: Secondary | ICD-10-CM

## 2022-11-01 DIAGNOSIS — M25512 Pain in left shoulder: Secondary | ICD-10-CM

## 2022-11-01 LAB — URINALYSIS
Bilirubin Urine: NEGATIVE
Hgb urine dipstick: NEGATIVE
Ketones, ur: NEGATIVE
Leukocytes,Ua: NEGATIVE
Nitrite: NEGATIVE
Specific Gravity, Urine: >=1.03 — AB (ref 1.000–1.030)
Total Protein, Urine: NEGATIVE
Urine Glucose: NEGATIVE
Urobilinogen, UA: 0.2 (ref 0.0–1.0)
pH: 5.5 (ref 5.0–8.0)

## 2022-11-01 LAB — CBC WITH DIFFERENTIAL/PLATELET
Basophils Absolute: 0.1 10*3/uL (ref 0.0–0.1)
Basophils Relative: 0.8 % (ref 0.0–3.0)
Eosinophils Absolute: 0.2 10*3/uL (ref 0.0–0.7)
Eosinophils Relative: 2.9 % (ref 0.0–5.0)
HCT: 48 % (ref 39.0–52.0)
Hemoglobin: 16.3 g/dL (ref 13.0–17.0)
Lymphocytes Relative: 27.2 % (ref 12.0–46.0)
Lymphs Abs: 1.8 10*3/uL (ref 0.7–4.0)
MCHC: 33.9 g/dL (ref 30.0–36.0)
MCV: 89.2 fl (ref 78.0–100.0)
Monocytes Absolute: 0.6 10*3/uL (ref 0.1–1.0)
Monocytes Relative: 9.2 % (ref 3.0–12.0)
Neutro Abs: 4 10*3/uL (ref 1.4–7.7)
Neutrophils Relative %: 59.9 % (ref 43.0–77.0)
Platelets: 204 10*3/uL (ref 150.0–400.0)
RBC: 5.39 Mil/uL (ref 4.22–5.81)
RDW: 13.5 % (ref 11.5–15.5)
WBC: 6.6 10*3/uL (ref 4.0–10.5)

## 2022-11-01 LAB — COMPREHENSIVE METABOLIC PANEL
ALT: 16 U/L (ref 0–53)
AST: 22 U/L (ref 0–37)
Albumin: 4.3 g/dL (ref 3.5–5.2)
Alkaline Phosphatase: 70 U/L (ref 39–117)
BUN: 22 mg/dL (ref 6–23)
CO2: 30 mEq/L (ref 19–32)
Calcium: 9.8 mg/dL (ref 8.4–10.5)
Chloride: 107 mEq/L (ref 96–112)
Creatinine, Ser: 1.2 mg/dL (ref 0.40–1.50)
GFR: 63.8 mL/min (ref >60.00)
Glucose, Bld: 83 mg/dL (ref 70–99)
Potassium: 4.4 mEq/L (ref 3.5–5.1)
Sodium: 142 mEq/L (ref 135–145)
Total Bilirubin: 0.7 mg/dL (ref 0.2–1.2)
Total Protein: 7 g/dL (ref 6.0–8.3)

## 2022-11-01 LAB — LIPID PANEL
Cholesterol: 130 mg/dL (ref 0–200)
HDL: 34 mg/dL — ABNORMAL LOW (ref >39.00)
LDL Cholesterol: 61 mg/dL (ref 0–99)
NonHDL: 95.96
Total CHOL/HDL Ratio: 4
Triglycerides: 176 mg/dL — ABNORMAL HIGH (ref 0.0–149.0)
VLDL: 35.2 mg/dL (ref 0.0–40.0)

## 2022-11-01 LAB — TSH: TSH: 1.85 u[IU]/mL (ref 0.35–5.50)

## 2022-11-01 LAB — PSA: PSA: 6.38 ng/mL — ABNORMAL HIGH (ref 0.10–4.00)

## 2022-11-01 MED ORDER — PHENTERMINE HCL 37.5 MG PO TABS: 37.5000 mg | ORAL_TABLET | Freq: Every day | ORAL | 2 refills | Status: DC

## 2022-11-01 NOTE — Patient Instructions (Addendum)
Per Dr Sninsky:  66-year-old male with a very long history of elevated PSA, which has been stable over the last few years.  Most recent PSA of 7.23 stable, and actually decreased from 9 last year with reassuring 40% free.  Negative biopsy x2 in 2013, negative prostate MRI in 2017 with no suspicious lesions, volume of 90 g, and reassuring PSA density of 0.08.  Extensive outside chart review of Dr. Stoneking and Dr. Borden's notes performed.   Reassurance was provided that PSA most likely secondary to enlarged prostate, and I would not recommend any further aggressive work-up at this time with prostate MRI or biopsy with the stability of the PSA, elevated percentage free, low PSA density.  Would only consider more aggressive work-up or prostate MRI if PSA rises greater than 10 on two values.   Follow-up with urology as needed, can continue yearly PSA with PCP through age 72, would refer to urology if has 2 values of >10   Brian Sninsky, MD 02/23/2022 

## 2022-11-01 NOTE — Assessment & Plan Note (Signed)
L shoulder pain - worse Will ref to Dr Rennis Chris

## 2022-11-01 NOTE — Assessment & Plan Note (Signed)
Mild  Reduce carbs Crestor low dose

## 2022-11-01 NOTE — Progress Notes (Signed)
Subjective:  Patient ID: Randy Knapp, male    DOB: June 10, 1956  Age: 67 y.o. MRN: [id number]  CC: Annual Exam (No concerns)   HPI Randy Knapp presents for a well exam  C/o L shoulder pain  Outpatient Medications Prior to Visit  Medication Sig Dispense Refill   Cholecalciferol (VITAMIN D3) 1000 UNITS tablet Take 1,000 Units by mouth daily.     glucosamine-chondroitin 500-400 MG tablet Take 1 tablet by mouth daily.     ibuprofen (ADVIL) 200 MG tablet Take 200 mg by mouth as needed.     losartan (COZAAR) 100 MG tablet TAKE 1 TABLET BY MOUTH EVERY DAY 90 tablet 3   Omega 3 1200 MG CAPS Take by mouth daily.     propranolol (INDERAL) 10 MG tablet Take 1 tablet (10 mg total) by mouth 2 (two) times daily as needed. For palpitations 60 tablet 3   propranolol ER (INDERAL LA) 60 MG 24 hr capsule Take 1 capsule (60 mg total) by mouth daily. 90 capsule 3   rosuvastatin (CRESTOR) 5 MG tablet TAKE 1 TABLET BY MOUTH EVERY DAY 90 tablet 3   SAXENDA 18 MG/3ML SOPN Inject 3 mg into the skin daily. 3 mL 5   No facility-administered medications prior to visit.    ROS: Review of Systems  Constitutional:  Negative for appetite change, fatigue and unexpected weight change.  HENT:  Negative for congestion, nosebleeds, sneezing, sore throat and trouble swallowing.   Eyes:  Negative for itching and visual disturbance.  Respiratory:  Negative for cough.   Cardiovascular:  Negative for chest pain, palpitations and leg swelling.  Gastrointestinal:  Negative for abdominal distention, blood in stool, diarrhea and nausea.  Genitourinary:  Negative for frequency and hematuria.  Musculoskeletal:  Negative for back pain, gait problem, joint swelling and neck pain.  Skin:  Negative for rash.  Neurological:  Negative for dizziness, tremors, speech difficulty and weakness.  Psychiatric/Behavioral:  Negative for agitation, dysphoric mood and sleep disturbance. The patient is not  nervous/anxious.     Objective:  BP 100/80 (BP Location: Left Arm, Patient Position: Sitting, Cuff Size: Normal)   Pulse (!) 57   Temp 98.1 F (36.7 C) (Oral)   Ht 6' (1.829 m)   Wt 256 lb (116.1 kg)   SpO2 97%   BMI 34.72 kg/m   BP Readings from Last 3 Encounters:  11/01/22 100/80  02/23/22 (!) 143/84  10/28/21 130/70    Wt Readings from Last 3 Encounters:  11/01/22 256 lb (116.1 kg)  02/23/22 240 lb (108.9 kg)  10/28/21 239 lb (108.4 kg)    Physical Exam Constitutional:      General: He is not in acute distress.    Appearance: He is well-developed.     Comments: NAD  Eyes:     Conjunctiva/sclera: Conjunctivae normal.     Pupils: Pupils are equal, round, and reactive to light.  Neck:     Thyroid: No thyromegaly.     Vascular: No JVD.  Cardiovascular:     Rate and Rhythm: Normal rate and regular rhythm.     Heart sounds: Normal heart sounds. No murmur heard.    No friction rub. No gallop.  Pulmonary:     Effort: Pulmonary effort is normal. No respiratory distress.     Breath sounds: Normal breath sounds. No wheezing or rales.  Chest:     Chest wall: No tenderness.  Abdominal:     General: Bowel sounds are normal. There is no  distension.     Palpations: Abdomen is soft. There is no mass.     Tenderness: There is no abdominal tenderness. There is no guarding or rebound.  Musculoskeletal:        General: No tenderness. Normal range of motion.     Cervical back: Normal range of motion.  Lymphadenopathy:     Cervical: No cervical adenopathy.  Skin:    General: Skin is warm and dry.     Findings: No rash.  Neurological:     Mental Status: He is alert and oriented to person, place, and time.     Cranial Nerves: No cranial nerve deficit.     Motor: No abnormal muscle tone.     Coordination: Coordination normal.     Gait: Gait normal.     Deep Tendon Reflexes: Reflexes are normal and symmetric.  Psychiatric:        Behavior: Behavior normal.        Thought  Content: Thought content normal.        Judgment: Judgment normal.     Lab Results  Component Value Date   WBC 6.6 11/01/2022   HGB 16.3 11/01/2022   HCT 48.0 11/01/2022   PLT 204.0 11/01/2022   GLUCOSE 83 11/01/2022   CHOL 130 11/01/2022   TRIG 176.0 (H) 11/01/2022   HDL 34.00 (L) 11/01/2022   LDLDIRECT 92.0 08/28/2017   LDLCALC 61 11/01/2022   ALT 16 11/01/2022   AST 22 11/01/2022   NA 142 11/01/2022   K 4.4 11/01/2022   CL 107 11/01/2022   CREATININE 1.20 11/01/2022   BUN 22 11/01/2022   CO2 30 11/01/2022   TSH 1.85 11/01/2022   PSA 6.38 (H) 11/01/2022    MR LIVER W WO CONTRAST  Result Date: 11/23/2019 CLINICAL DATA:  Right upper quadrant pain EXAM: MRI ABDOMEN WITHOUT AND WITH CONTRAST TECHNIQUE: Multiplanar multisequence MR imaging of the abdomen was performed both before and after the administration of intravenous contrast. CONTRAST:  10mL GADAVIST GADOBUTROL 1 MMOL/ML IV SOLN COMPARISON:  Ultrasound dated 11/12/2019 FINDINGS: Lower chest: No acute findings. Hepatobiliary: Exam detail is mildly diminished due to motion artifact. No suspicious liver lesion identified. No focal areas of abnormal enhancement noted. Gallbladder is unremarkable. No bile duct dilatation. Pancreas:  No main duct dilatation, inflammation or mass identified. Spleen:  Within normal limits in size and appearance. Adrenals/Urinary Tract: Normal adrenal glands. Bilateral cystic kidney lesions are identified. Within the limitations of motion artifact no enhancing kidney lesions noted. The largest kidney cyst is in the inferior pole of the left kidney measuring 1.8 cm. Cyst arising from upper pole of right kidney measures 1.5 cm. Anterior cortex left mid kidney cyst measures 1.1 cm. Additional fluid signal intensity kidney lesions also likely represent simple cysts but are too small to reliably characterize Stomach/Bowel: Visualized portions within the abdomen are unremarkable. Vascular/Lymphatic:  No aneurysm.   No abdominopelvic adenopathy Other:  No free fluid or fluid collection Musculoskeletal: Lumbar compression deformities are identified. No suspicious bone lesion noted. IMPRESSION: 1. Mild motion degraded exam. 2. No suspicious enhancing liver lesions identified. 3. Bilateral kidney cysts. 4. Age-indeterminate lumbar compression deformities. Electronically Signed   By: Signa Kell M.D.   On: 11/23/2019 15:23    Assessment & Plan:   Problem List Items Addressed This Visit     SHOULDER PAIN     L shoulder pain - worse Will ref to Dr Rennis Chris      PROSTATE SPECIFIC ANTIGEN, ELEVATED  Per Dr Richardo Hanks:  67 year old male with a very long history of elevated PSA, which has been stable over the last few years.  Most recent PSA of 7.23 stable, and actually decreased from 9 last year with reassuring 40% free.  Negative biopsy x2 in 2013, negative prostate MRI in 2017 with no suspicious lesions, volume of 90 g, and reassuring PSA density of 0.08.  Extensive outside chart review of Dr. Pete Glatter and Dr. Vevelyn Royals notes performed.   Reassurance was provided that PSA most likely secondary to enlarged prostate, and I would not recommend any further aggressive work-up at this time with prostate MRI or biopsy with the stability of the PSA, elevated percentage free, low PSA density.  Would only consider more aggressive work-up or prostate MRI if PSA rises greater than 10 on two values.   Follow-up with urology as needed, can continue yearly PSA with PCP through age 74, would refer to urology if has 2 values of >10   Legrand Rams, MD 02/23/2022      Well adult exam - Primary   Relevant Orders   TSH (Completed)   Urinalysis (Completed)   CBC with Differential/Platelet (Completed)   Lipid panel (Completed)   PSA (Completed)   Comprehensive metabolic panel (Completed)   Dyslipidemia    Mild  Reduce carbs Crestor low dose      HTN (hypertension)    Propranolol 10 mg prn      Other Visit  Diagnoses     Chronic left shoulder pain       Relevant Orders   Ambulatory referral to Orthopedic Surgery         Meds ordered this encounter  Medications   phentermine (ADIPEX-P) 37.5 MG tablet    Sig: Take 1 tablet (37.5 mg total) by mouth daily before breakfast.    Dispense:  30 tablet    Refill:  2      Follow-up: Return in about 3 months (around 01/31/2023) for a follow-up visit.  Sonda Primes, MD

## 2022-11-01 NOTE — Assessment & Plan Note (Signed)
Per Dr Richardo Hanks:  67 year old male with a very long history of elevated PSA, which has been stable over the last few years.  Most recent PSA of 7.23 stable, and actually decreased from 9 last year with reassuring 40% free.  Negative biopsy x2 in 2013, negative prostate MRI in 2017 with no suspicious lesions, volume of 90 g, and reassuring PSA density of 0.08.  Extensive outside chart review of Dr. Pete Glatter and Dr. Vevelyn Royals notes performed.   Reassurance was provided that PSA most likely secondary to enlarged prostate, and I would not recommend any further aggressive work-up at this time with prostate MRI or biopsy with the stability of the PSA, elevated percentage free, low PSA density.  Would only consider more aggressive work-up or prostate MRI if PSA rises greater than 10 on two values.   Follow-up with urology as needed, can continue yearly PSA with PCP through age 43, would refer to urology if has 2 values of >10   Legrand Rams, MD 02/23/2022

## 2022-11-01 NOTE — Assessment & Plan Note (Signed)
Propranolol 10 mg prn

## 2022-11-12 ENCOUNTER — Encounter: Payer: Self-pay | Admitting: Internal Medicine

## 2022-12-08 ENCOUNTER — Other Ambulatory Visit: Payer: Self-pay | Admitting: Internal Medicine

## 2022-12-10 ENCOUNTER — Other Ambulatory Visit: Payer: Self-pay | Admitting: Internal Medicine

## 2023-11-08 ENCOUNTER — Ambulatory Visit: Payer: BC Managed Care – PPO | Admitting: Internal Medicine

## 2023-11-08 ENCOUNTER — Encounter: Payer: Self-pay | Admitting: Internal Medicine

## 2023-11-08 VITALS — BP 110/78 | HR 55 | Temp 98.0°F | Ht 72.0 in | Wt 244.6 lb

## 2023-11-08 DIAGNOSIS — R635 Abnormal weight gain: Secondary | ICD-10-CM

## 2023-11-08 DIAGNOSIS — Z0000 Encounter for general adult medical examination without abnormal findings: Secondary | ICD-10-CM

## 2023-11-08 DIAGNOSIS — Z125 Encounter for screening for malignant neoplasm of prostate: Secondary | ICD-10-CM

## 2023-11-08 DIAGNOSIS — R972 Elevated prostate specific antigen [PSA]: Secondary | ICD-10-CM

## 2023-11-08 DIAGNOSIS — I10 Essential (primary) hypertension: Secondary | ICD-10-CM

## 2023-11-08 DIAGNOSIS — I2583 Coronary atherosclerosis due to lipid rich plaque: Secondary | ICD-10-CM

## 2023-11-08 DIAGNOSIS — Z13220 Encounter for screening for lipoid disorders: Secondary | ICD-10-CM | POA: Diagnosis not present

## 2023-11-08 NOTE — Assessment & Plan Note (Signed)
 On crestor.

## 2023-11-08 NOTE — Assessment & Plan Note (Signed)
Pt lost wt on Saxenda ?

## 2023-11-08 NOTE — Assessment & Plan Note (Signed)
Propranolol 10 mg prn

## 2023-11-08 NOTE — Progress Notes (Signed)
 Subjective:  Patient ID: Randy Knapp, male    DOB: 11-16-1955  Age: 68 y.o. MRN: [id number]  CC: Annual Exam (Annual Exam. Last seen 11/01/22)   HPI Randy Knapp presents for HTN, dyslipidemia, obesity  Well exam  Outpatient Medications Prior to Visit  Medication Sig Dispense Refill   Cholecalciferol (VITAMIN D3) 1000 UNITS tablet Take 1,000 Units by mouth daily.     glucosamine-chondroitin 500-400 MG tablet Take 1 tablet by mouth daily.     ibuprofen (ADVIL) 200 MG tablet Take 200 mg by mouth as needed.     losartan  (COZAAR ) 100 MG tablet TAKE 1 TABLET BY MOUTH EVERY DAY 90 tablet 3   Omega 3 1200 MG CAPS Take by mouth daily.     propranolol  (INDERAL ) 10 MG tablet Take 1 tablet (10 mg total) by mouth 2 (two) times daily as needed. For palpitations 60 tablet 3   propranolol  ER (INDERAL  LA) 60 MG 24 hr capsule TAKE 1 CAPSULE BY MOUTH EVERY DAY 90 capsule 3   rosuvastatin  (CRESTOR ) 5 MG tablet TAKE 1 TABLET BY MOUTH EVERY DAY 180 tablet 2   SAXENDA  18 MG/3ML SOPN Inject 3 mg into the skin daily. 3 mL 5   phentermine  (ADIPEX-P ) 37.5 MG tablet Take 1 tablet (37.5 mg total) by mouth daily before breakfast. 30 tablet 2   No facility-administered medications prior to visit.    ROS: Review of Systems  Constitutional:  Negative for appetite change, fatigue and unexpected weight change.  HENT:  Negative for congestion, nosebleeds, sneezing, sore throat and trouble swallowing.   Eyes:  Negative for itching and visual disturbance.  Respiratory:  Negative for cough.   Cardiovascular:  Negative for chest pain, palpitations and leg swelling.  Gastrointestinal:  Negative for abdominal distention, blood in stool, diarrhea and nausea.  Genitourinary:  Negative for frequency and hematuria.  Musculoskeletal:  Negative for back pain, gait problem, joint swelling and neck pain.  Skin:  Negative for rash.  Neurological:  Negative for dizziness, tremors, speech difficulty  and weakness.  Psychiatric/Behavioral:  Negative for agitation, dysphoric mood, sleep disturbance and suicidal ideas. The patient is not nervous/anxious.     Objective:  BP 110/78   Pulse (!) 55   Temp 98 F (36.7 C)   Ht 6' (1.829 m)   Wt 244 lb 9.6 oz (110.9 kg)   SpO2 95%   BMI 33.17 kg/m   BP Readings from Last 3 Encounters:  11/08/23 110/78  11/01/22 100/80  02/23/22 (!) 143/84    Wt Readings from Last 3 Encounters:  11/08/23 244 lb 9.6 oz (110.9 kg)  11/01/22 256 lb (116.1 kg)  02/23/22 240 lb (108.9 kg)    Physical Exam Constitutional:      General: He is not in acute distress.    Appearance: He is well-developed.     Comments: NAD  Eyes:     Conjunctiva/sclera: Conjunctivae normal.     Pupils: Pupils are equal, round, and reactive to light.  Neck:     Thyroid : No thyromegaly.     Vascular: No JVD.  Cardiovascular:     Rate and Rhythm: Normal rate and regular rhythm.     Heart sounds: Normal heart sounds. No murmur heard.    No friction rub. No gallop.  Pulmonary:     Effort: Pulmonary effort is normal. No respiratory distress.     Breath sounds: Normal breath sounds. No wheezing or rales.  Chest:     Chest wall: No  tenderness.  Abdominal:     General: Bowel sounds are normal. There is no distension.     Palpations: Abdomen is soft. There is no mass.     Tenderness: There is no abdominal tenderness. There is no guarding or rebound.  Musculoskeletal:        General: No tenderness. Normal range of motion.     Cervical back: Normal range of motion.  Lymphadenopathy:     Cervical: No cervical adenopathy.  Skin:    General: Skin is warm and dry.     Findings: No rash.  Neurological:     Mental Status: He is alert and oriented to person, place, and time.     Cranial Nerves: No cranial nerve deficit.     Motor: No abnormal muscle tone.     Coordination: Coordination normal.     Gait: Gait normal.     Deep Tendon Reflexes: Reflexes are normal and  symmetric.  Psychiatric:        Behavior: Behavior normal.        Thought Content: Thought content normal.        Judgment: Judgment normal.   Rectal-Per urology  Lab Results  Component Value Date   WBC 5.7 11/08/2023   HGB 16.9 11/08/2023   HCT 49.3 11/08/2023   PLT 194.0 11/08/2023   GLUCOSE 89 11/08/2023   CHOL 97 11/08/2023   TRIG 88.0 11/08/2023   HDL 35.20 (L) 11/08/2023   LDLDIRECT 92.0 08/28/2017   LDLCALC 44 11/08/2023   ALT 16 11/08/2023   AST 19 11/08/2023   NA 143 11/08/2023   K 4.3 11/08/2023   CL 106 11/08/2023   CREATININE 1.12 11/08/2023   BUN 23 11/08/2023   CO2 30 11/08/2023   TSH 2.33 11/08/2023   PSA 7.73 (H) 11/08/2023    MR LIVER W WO CONTRAST Result Date: 11/23/2019 CLINICAL DATA:  Right upper quadrant pain EXAM: MRI ABDOMEN WITHOUT AND WITH CONTRAST TECHNIQUE: Multiplanar multisequence MR imaging of the abdomen was performed both before and after the administration of intravenous contrast. CONTRAST:  10mL GADAVIST  GADOBUTROL  1 MMOL/ML IV SOLN COMPARISON:  Ultrasound dated 11/12/2019 FINDINGS: Lower chest: No acute findings. Hepatobiliary: Exam detail is mildly diminished due to motion artifact. No suspicious liver lesion identified. No focal areas of abnormal enhancement noted. Gallbladder is unremarkable. No bile duct dilatation. Pancreas:  No main duct dilatation, inflammation or mass identified. Spleen:  Within normal limits in size and appearance. Adrenals/Urinary Tract: Normal adrenal glands. Bilateral cystic kidney lesions are identified. Within the limitations of motion artifact no enhancing kidney lesions noted. The largest kidney cyst is in the inferior pole of the left kidney measuring 1.8 cm. Cyst arising from upper pole of right kidney measures 1.5 cm. Anterior cortex left mid kidney cyst measures 1.1 cm. Additional fluid signal intensity kidney lesions also likely represent simple cysts but are too small to reliably characterize Stomach/Bowel:  Visualized portions within the abdomen are unremarkable. Vascular/Lymphatic:  No aneurysm.  No abdominopelvic adenopathy Other:  No free fluid or fluid collection Musculoskeletal: Lumbar compression deformities are identified. No suspicious bone lesion noted. IMPRESSION: 1. Mild motion degraded exam. 2. No suspicious enhancing liver lesions identified. 3. Bilateral kidney cysts. 4. Age-indeterminate lumbar compression deformities. Electronically Signed   By: Kimberley Penman M.D.   On: 11/23/2019 15:23    Assessment & Plan:   Problem List Items Addressed This Visit     Elevated PSA - Primary   S/p bx x2 (-) MRI (-)  Well adult exam   We discussed age appropriate health related issues, including available/recomended screening tests and vaccinations. We discussed a need for adhering to healthy diet and exercise. Labs/EKG were reviewed/ordered. All questions were answered. Repeat PSA Last colon --  2020      Relevant Orders   Comprehensive metabolic panel with GFR (Completed)   TSH (Completed)   CBC with Differential/Platelet (Completed)   Lipid panel (Completed)   Urinalysis (Completed)   PSA (Completed)   Coronary atherosclerosis   On crestor       HTN (hypertension)   Propranolol  10 mg prn      Weight gain   Pt lost wt on Saxenda          No orders of the defined types were placed in this encounter.     Follow-up: Return in about 6 months (around 05/09/2024) for Wellness Exam, a follow-up visit.  Anitra Barn, MD

## 2023-11-08 NOTE — Assessment & Plan Note (Signed)
 S/p bx x2 (-) MRI (-)

## 2023-11-09 LAB — CBC WITH DIFFERENTIAL/PLATELET
Basophils Absolute: 0.1 10*3/uL (ref 0.0–0.1)
Basophils Relative: 1.2 % (ref 0.0–3.0)
Eosinophils Absolute: 0.2 10*3/uL (ref 0.0–0.7)
Eosinophils Relative: 3.3 % (ref 0.0–5.0)
HCT: 49.3 % (ref 39.0–52.0)
Hemoglobin: 16.9 g/dL (ref 13.0–17.0)
Lymphocytes Relative: 32.6 % (ref 12.0–46.0)
Lymphs Abs: 1.9 10*3/uL (ref 0.7–4.0)
MCHC: 34.4 g/dL (ref 30.0–36.0)
MCV: 89.5 fl (ref 78.0–100.0)
Monocytes Absolute: 0.5 10*3/uL (ref 0.1–1.0)
Monocytes Relative: 9.3 % (ref 3.0–12.0)
Neutro Abs: 3.1 10*3/uL (ref 1.4–7.7)
Neutrophils Relative %: 53.6 % (ref 43.0–77.0)
Platelets: 194 10*3/uL (ref 150.0–400.0)
RBC: 5.51 Mil/uL (ref 4.22–5.81)
RDW: 13.4 % (ref 11.5–15.5)
WBC: 5.7 10*3/uL (ref 4.0–10.5)

## 2023-11-09 LAB — COMPREHENSIVE METABOLIC PANEL WITH GFR
ALT: 16 U/L (ref 0–53)
AST: 19 U/L (ref 0–37)
Albumin: 4.3 g/dL (ref 3.5–5.2)
Alkaline Phosphatase: 82 U/L (ref 39–117)
BUN: 23 mg/dL (ref 6–23)
CO2: 30 meq/L (ref 19–32)
Calcium: 9.6 mg/dL (ref 8.4–10.5)
Chloride: 106 meq/L (ref 96–112)
Creatinine, Ser: 1.12 mg/dL (ref 0.40–1.50)
GFR: 68.81 mL/min (ref >60.00)
Glucose, Bld: 89 mg/dL (ref 70–99)
Potassium: 4.3 meq/L (ref 3.5–5.1)
Sodium: 143 meq/L (ref 135–145)
Total Bilirubin: 1 mg/dL (ref 0.2–1.2)
Total Protein: 7.2 g/dL (ref 6.0–8.3)

## 2023-11-09 LAB — URINALYSIS
Bilirubin Urine: NEGATIVE
Hgb urine dipstick: NEGATIVE
Ketones, ur: NEGATIVE
Leukocytes,Ua: NEGATIVE
Nitrite: NEGATIVE
Specific Gravity, Urine: 1.025 (ref 1.000–1.030)
Total Protein, Urine: NEGATIVE
Urine Glucose: NEGATIVE
Urobilinogen, UA: 0.2 (ref 0.0–1.0)
pH: 6 (ref 5.0–8.0)

## 2023-11-09 LAB — LIPID PANEL
Cholesterol: 97 mg/dL (ref 0–200)
HDL: 35.2 mg/dL — ABNORMAL LOW (ref >39.00)
LDL Cholesterol: 44 mg/dL (ref 0–99)
NonHDL: 61.87
Total CHOL/HDL Ratio: 3
Triglycerides: 88 mg/dL (ref 0.0–149.0)
VLDL: 17.6 mg/dL (ref 0.0–40.0)

## 2023-11-09 LAB — PSA: PSA: 7.73 ng/mL — ABNORMAL HIGH (ref 0.10–4.00)

## 2023-11-09 LAB — TSH: TSH: 2.33 u[IU]/mL (ref 0.35–5.50)

## 2023-11-14 ENCOUNTER — Encounter: Payer: Self-pay | Admitting: Internal Medicine

## 2023-11-14 ENCOUNTER — Other Ambulatory Visit: Payer: Self-pay | Admitting: Internal Medicine

## 2023-11-14 DIAGNOSIS — R972 Elevated prostate specific antigen [PSA]: Secondary | ICD-10-CM

## 2023-11-14 NOTE — Assessment & Plan Note (Signed)
 We discussed age appropriate health related issues, including available/recomended screening tests and vaccinations. We discussed a need for adhering to healthy diet and exercise. Labs/EKG were reviewed/ordered. All questions were answered. Repeat PSA Last colon --  2020

## 2023-11-15 ENCOUNTER — Encounter: Payer: Self-pay | Admitting: Internal Medicine

## 2023-11-28 ENCOUNTER — Other Ambulatory Visit: Payer: Self-pay

## 2023-11-28 ENCOUNTER — Emergency Department (HOSPITAL_BASED_OUTPATIENT_CLINIC_OR_DEPARTMENT_OTHER)
Admission: EM | Admit: 2023-11-28 | Discharge: 2023-11-28 | Disposition: A | Discharge status: Home / Self Care | Attending: Emergency Medicine | Admitting: Emergency Medicine

## 2023-11-28 ENCOUNTER — Other Ambulatory Visit (HOSPITAL_BASED_OUTPATIENT_CLINIC_OR_DEPARTMENT_OTHER): Payer: Self-pay

## 2023-11-28 ENCOUNTER — Encounter (HOSPITAL_BASED_OUTPATIENT_CLINIC_OR_DEPARTMENT_OTHER): Payer: Self-pay | Admitting: Emergency Medicine

## 2023-11-28 DIAGNOSIS — I10 Essential (primary) hypertension: Secondary | ICD-10-CM | POA: Insufficient documentation

## 2023-11-28 DIAGNOSIS — Z79899 Other long term (current) drug therapy: Secondary | ICD-10-CM | POA: Insufficient documentation

## 2023-11-28 DIAGNOSIS — R040 Epistaxis: Secondary | ICD-10-CM | POA: Diagnosis present

## 2023-11-28 MED ORDER — OXYMETAZOLINE HCL 0.05 % NA SOLN
1.0000 | Freq: Two times a day (BID) | NASAL | 0 refills | Status: AC
  Filled 2023-11-28: qty 30, 150d supply, fill #0

## 2023-11-28 NOTE — ED Triage Notes (Signed)
 Pt endorses RT nares epistaxis today. Referred by UC d/t inability to stop bleeding. Reports was told is posterior bleed. Denies thinners

## 2023-11-28 NOTE — ED Notes (Signed)
 Discharge paperwork given and verbally understood.

## 2023-11-28 NOTE — ED Provider Notes (Signed)
 [place] EMERGENCY DEPARTMENT AT [facility] Provider Note   CSN: [id number] Arrival date & time: 11/28/23  7829     Chief Complaint  Patient presents with   Epistaxis    Jesse Elchonon Maxson is a 68 y.o. male.   Epistaxis Patient is a 68 year old male presents ED today with concerns of right-sided epistaxis that began approximately 2 hours ago, seen by urgent care today and was told it was a posterior nosebleed and told to come to the ED.  States that they saw where the bleed was originating.  Reports that they tried foam packing but did not use any medications.  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  Treated recently for sinusitis and provided Augmentin, finishing antibiotic this past week. Denies known coagulopathies or hemophilia, denies being on blood thinners.  Denies headache, visual changes, chest pain, shortness of breath, abdominal pain, nausea, vomiting, melena, hematochezia, dysuria, hematuria, joint swelling, bleeding gums, easy bruising.     Home Medications Prior to Admission medications   Medication Sig Start Date End Date Taking? Authorizing Provider  oxymetazoline (AFRIN NASAL SPRAY) 0.05 % nasal spray Place 1 spray into both nostrils 2 (two) times daily. 11/28/23  Yes Hayes Lipps, PA-C  Cholecalciferol (VITAMIN D3) 1000 UNITS tablet Take 1,000 Units by mouth daily.    [provider]  glucosamine-chondroitin 500-400 MG tablet Take 1 tablet by mouth daily.    [provider]  ibuprofen (ADVIL) 200 MG tablet Take 200 mg by mouth as needed.    [provider]  losartan  (COZAAR ) 100 MG tablet TAKE 1 TABLET BY MOUTH EVERY DAY 02/25/22   Plotnikov, Aleksei V, MD  Omega 3 1200 MG CAPS Take by mouth daily.    [provider]  phentermine  (ADIPEX-P ) 37.5 MG tablet Take 1 tablet (37.5 mg total) by mouth daily before breakfast. 11/01/22 12/01/22   Plotnikov, Oakley Bellman, MD  propranolol  (INDERAL ) 10 MG tablet Take 1 tablet (10 mg total) by mouth 2 (two) times daily as needed. For palpitations 09/03/21   Plotnikov, Oakley Bellman, MD  propranolol  ER (INDERAL  LA) 60 MG 24 hr capsule TAKE 1 CAPSULE BY MOUTH EVERY DAY 12/08/22   Plotnikov, Aleksei V, MD  rosuvastatin  (CRESTOR ) 5 MG tablet TAKE 1 TABLET BY MOUTH EVERY DAY 12/12/22   Plotnikov, Aleksei V, MD  SAXENDA  18 MG/3ML SOPN Inject 3 mg into the skin daily. 03/22/22   Plotnikov, Oakley Bellman, MD      Allergies    Patient has no known allergies.    Review of Systems   Review of Systems  HENT:  Positive for nosebleeds.   All other systems reviewed and are negative.   Physical Exam Updated Vital Signs BP (!) 139/97 (BP Location: Right Arm)   Pulse 64   Temp 98.2 F (36.8 C) (Oral)   Resp 18   SpO2 100%  Physical Exam Vitals and nursing note reviewed.  Constitutional:      General: He is not in acute distress.    Appearance: Normal appearance. He is not ill-appearing.  HENT:     Head: Normocephalic and atraumatic.     Nose:     Comments: Bleed noted to be anterior, controlled at this time.    Mouth/Throat:     Mouth: Mucous membranes are moist.     Pharynx: Oropharynx is clear. No oropharyngeal exudate or posterior oropharyngeal erythema.     Comments: No  blood noted in the posterior oropharynx Eyes:     General: No scleral icterus.       Right eye: No discharge.        Left eye: No discharge.     Extraocular Movements: Extraocular movements intact.     Conjunctiva/sclera: Conjunctivae normal.  Cardiovascular:     Rate and Rhythm: Normal rate and regular rhythm.     Pulses: Normal pulses.     Heart sounds: Normal heart sounds. No murmur heard.    No friction rub. No gallop.  Pulmonary:     Effort: Pulmonary effort is normal. No respiratory distress.     Breath sounds: Normal breath sounds. No stridor. No wheezing, rhonchi or rales.  Abdominal:     General: Abdomen is flat.  There is no distension.     Palpations: Abdomen is soft.     Tenderness: There is no abdominal tenderness. There is no guarding.  Musculoskeletal:     Cervical back: Normal range of motion. No rigidity or tenderness.     Right lower leg: No edema.     Left lower leg: No edema.  Skin:    General: Skin is warm and dry.     Findings: No bruising or erythema.  Neurological:     General: No focal deficit present.     Mental Status: He is alert and oriented to person, place, and time. Mental status is at baseline.     Sensory: No sensory deficit.     Motor: No weakness.     Gait: Gait normal.  Psychiatric:        Mood and Affect: Mood normal.     ED Results / Procedures / Treatments   Labs (all labs ordered are listed, but only abnormal results are displayed) Labs Reviewed - No data to display  EKG None  Radiology No results found.  Procedures Procedures    Medications Ordered in ED Medications - No data to display  ED Course/ Medical Decision Making/ A&P                                 Medical Decision Making Risk OTC drugs.   This patient is a 68 year old male who presents to the ED for concern of right-sided epistaxis x approximately 2 hours.    On physical exam, patient is in no acute distress, afebrile, alert and orient x 4, speaking in full sentences, nontachypneic, nontachycardic.  Epistaxis noted to have been controlled at this time on evaluation.  Bleeding source appears to be anterior along the septum.  No blood noted in oropharynx.  Unremarkable physical exam otherwise  With bleeding controlled at this time, initial packing from urgent care was removed and bleeding was controlled at this time.  Nose clamps were used for another 15 minutes and on reevaluation bleeding has still not resumed.  Patient was monitored without any packing or pressure and bleeding still controlled. Low suspicion for any pathologic reasons for bleeding today including low suspicion for  hemophilia, coagulopathy, etc. will send home with prescription of Afrin in case the bleeding resumes as well as will send home with nasal clamps.  Will have him return if bleeding is not controlled with these measures at home.  Patient vital signs have remained stable throughout the course of patient's time in the ED. Low suspicion for any other emergent pathology at this time. I believe this patient is safe to be discharged.  Provided strict return to ER precautions. Patient expressed agreement and understanding of plan. All questions were answered.  Differential diagnoses prior to evaluation: The emergent differential diagnosis includes, but is not limited to, posterior versus anterior epistaxis, coagulopathy, hemophilia, trauma, HTN, allergies, sinusitis. This is not an exhaustive differential.   Past Medical History / Co-morbidities / Social History: HTN, prostatitis, HLD, GERD  Additional history: Chart reviewed. Pertinent results include:   Seen in 11/17/2023 for sinusitis, provided Augmentin at that time.  Lab Tests/Imaging studies: The patient currently being asymptomatic, there are believe that labs are warranted at this time and his bleeding has been controlled, no signs of symptomatic anemia at this time.   Medications:  I have reviewed the patients home medicines and have made adjustments as needed.   Disposition: After consideration of the diagnostic results and the patients response to treatment, I feel that the patient would benefit from discharge treatment as above.   emergency department workup does not suggest an emergent condition requiring admission or immediate intervention beyond what has been performed at this time. The plan is: Sending home with acetazolamide and clamp for nosebleed if recurrence, follow-up PCP for persistent symptoms, return to ED for any new or worsening symptoms or if bleeding unable to be controlled at home. The patient is safe for discharge and has  been instructed to return immediately for worsening symptoms, change in symptoms or any other concerns.    Final Clinical Impression(s) / ED Diagnoses Final diagnoses:  Epistaxis    Rx / DC Orders ED Discharge Orders          Ordered    oxymetazoline (AFRIN NASAL SPRAY) 0.05 % nasal spray  2 times daily        11/28/23 [address], PA-C 11/28/23 1108    Sueellen Emery, MD 11/28/23 1414

## 2023-11-28 NOTE — Discharge Instructions (Addendum)
 You were seen today for R sided nosebleed. Bleeding was controlled here with direct pressure.  This appeared to be an anterior nosebleed.  With bleeding controlled at this time, no need for further treatments.  However will send home with Afrin no spray as well as clamp to use if bleeding recurs.  You can take up to 2 doses of this spray, following directions on the box.  When using the spray, be sure to blow out all of the clots in your nose before and to use clamp directly after for 15 minutes before reusing the medication.  Return to the ED if these measures are unable to control and if nosebleeds continue.  If bleeding persists intermittently but is controlled, recommend you follow-up with an ENT and/or PCP for reevaluation.

## 2024-03-06 ENCOUNTER — Other Ambulatory Visit: Payer: Self-pay | Admitting: Internal Medicine

## 2024-04-23 ENCOUNTER — Ambulatory Visit

## 2024-04-23 DIAGNOSIS — Z23 Encounter for immunization: Secondary | ICD-10-CM

## 2024-04-24 DIAGNOSIS — Z23 Encounter for immunization: Secondary | ICD-10-CM | POA: Diagnosis not present

## 2024-05-14 ENCOUNTER — Ambulatory Visit: Admitting: Internal Medicine

## 2024-05-14 ENCOUNTER — Encounter: Payer: Self-pay | Admitting: Internal Medicine

## 2024-05-14 VITALS — BP 118/82 | HR 62 | Temp 97.9°F | Ht 72.0 in | Wt 237.2 lb

## 2024-05-14 DIAGNOSIS — R002 Palpitations: Secondary | ICD-10-CM | POA: Diagnosis not present

## 2024-05-14 DIAGNOSIS — I10 Essential (primary) hypertension: Secondary | ICD-10-CM

## 2024-05-14 DIAGNOSIS — E785 Hyperlipidemia, unspecified: Secondary | ICD-10-CM | POA: Diagnosis not present

## 2024-05-14 DIAGNOSIS — R030 Elevated blood-pressure reading, without diagnosis of hypertension: Secondary | ICD-10-CM | POA: Diagnosis not present

## 2024-05-14 DIAGNOSIS — R972 Elevated prostate specific antigen [PSA]: Secondary | ICD-10-CM

## 2024-05-14 NOTE — Assessment & Plan Note (Signed)
 BP Readings from Last 3 Encounters:  05/14/24 118/82  11/28/23 (!) 139/97  11/08/23 110/78

## 2024-05-14 NOTE — Assessment & Plan Note (Signed)
 S/p bx x2 (-) MRI (-)

## 2024-05-14 NOTE — Progress Notes (Signed)
 Subjective:  Patient ID: Randy Knapp, male    DOB: 1955/12/15  Age: 68 y.o. MRN: [id number]  CC: Medical Management of Chronic Issues (6 Month follow up)   HPI Randy Knapp presents for CAD, dyslipidemia Getting [employer] Semaglutide injections  Outpatient Medications Prior to Visit  Medication Sig Dispense Refill   Cholecalciferol (VITAMIN D3) 1000 UNITS tablet Take 1,000 Units by mouth daily.     ibuprofen (ADVIL) 200 MG tablet Take 200 mg by mouth as needed.     Omega 3 1200 MG CAPS Take by mouth daily.     oxymetazoline  (AFRIN NASAL SPRAY) 0.05 % nasal spray Place 1 spray into both nostrils 2 (two) times daily. 30 mL 0   propranolol  (INDERAL ) 10 MG tablet Take 1 tablet (10 mg total) by mouth 2 (two) times daily as needed. For palpitations 60 tablet 3   propranolol  ER (INDERAL  LA) 60 MG 24 hr capsule TAKE 1 CAPSULE BY MOUTH EVERY DAY 90 capsule 3   rosuvastatin  (CRESTOR ) 5 MG tablet TAKE 1 TABLET BY MOUTH EVERY DAY 180 tablet 2   glucosamine-chondroitin 500-400 MG tablet Take 1 tablet by mouth daily. (Patient not taking: Reported on 05/14/2024)     losartan  (COZAAR ) 100 MG tablet TAKE 1 TABLET BY MOUTH EVERY DAY (Patient not taking: Reported on 05/14/2024) 90 tablet 3   phentermine  (ADIPEX-P ) 37.5 MG tablet Take 1 tablet (37.5 mg total) by mouth daily before breakfast. (Patient not taking: Reported on 05/14/2024) 30 tablet 2   SAXENDA  18 MG/3ML SOPN Inject 3 mg into the skin daily. (Patient not taking: Reported on 05/14/2024) 3 mL 5   No facility-administered medications prior to visit.    ROS: Review of Systems  Constitutional:  Negative for appetite change, fatigue and unexpected weight change.  HENT:  Negative for congestion, nosebleeds, sneezing, sore throat and trouble swallowing.   Eyes:  Negative for itching and visual disturbance.  Respiratory:  Negative for cough.   Cardiovascular:  Negative for chest pain, palpitations and leg swelling.   Gastrointestinal:  Negative for abdominal distention, blood in stool, diarrhea and nausea.  Genitourinary:  Negative for frequency and hematuria.  Musculoskeletal:  Negative for back pain, gait problem, joint swelling and neck pain.  Skin:  Negative for rash.  Neurological:  Negative for dizziness, tremors, speech difficulty and weakness.  Psychiatric/Behavioral:  Negative for agitation, dysphoric mood, sleep disturbance and suicidal ideas. The patient is not nervous/anxious.     Objective:  BP 118/82   Pulse 62   Temp 97.9 F (36.6 C)   Ht 6' (1.829 m)   Wt 237 lb 3.2 oz (107.6 kg)   SpO2 96%   BMI 32.17 kg/m   BP Readings from Last 3 Encounters:  05/14/24 118/82  11/28/23 (!) 139/97  11/08/23 110/78    Wt Readings from Last 3 Encounters:  05/14/24 237 lb 3.2 oz (107.6 kg)  11/08/23 244 lb 9.6 oz (110.9 kg)  11/01/22 256 lb (116.1 kg)    Physical Exam Constitutional:      General: He is not in acute distress.    Appearance: He is well-developed.     Comments: NAD  Eyes:     Conjunctiva/sclera: Conjunctivae normal.     Pupils: Pupils are equal, round, and reactive to light.  Neck:     Thyroid : No thyromegaly.     Vascular: No JVD.  Cardiovascular:     Rate and Rhythm: Normal rate and regular rhythm.     Heart  sounds: Normal heart sounds. No murmur heard.    No friction rub. No gallop.  Pulmonary:     Effort: Pulmonary effort is normal. No respiratory distress.     Breath sounds: Normal breath sounds. No wheezing or rales.  Chest:     Chest wall: No tenderness.  Abdominal:     General: Bowel sounds are normal. There is no distension.     Palpations: Abdomen is soft. There is no mass.     Tenderness: There is no abdominal tenderness. There is no guarding or rebound.  Musculoskeletal:        General: No tenderness. Normal range of motion.     Cervical back: Normal range of motion.     Right lower leg: No edema.     Left lower leg: No edema.  Lymphadenopathy:      Cervical: No cervical adenopathy.  Skin:    General: Skin is warm and dry.     Findings: No rash.  Neurological:     Mental Status: He is alert and oriented to person, place, and time.     Cranial Nerves: No cranial nerve deficit.     Motor: No abnormal muscle tone.     Coordination: Coordination normal.     Gait: Gait normal.     Deep Tendon Reflexes: Reflexes are normal and symmetric.  Psychiatric:        Behavior: Behavior normal.        Thought Content: Thought content normal.        Judgment: Judgment normal.     Lab Results  Component Value Date   WBC 6.2 05/14/2024   HGB 16.3 05/14/2024   HCT 47.8 05/14/2024   PLT 184.0 05/14/2024   GLUCOSE 82 05/14/2024   CHOL 102 05/14/2024   TRIG 91.0 05/14/2024   HDL 37.30 (L) 05/14/2024   LDLDIRECT 92.0 08/28/2017   LDLCALC 47 05/14/2024   ALT 16 05/14/2024   AST 19 05/14/2024   NA 142 05/14/2024   K 4.0 05/14/2024   CL 104 05/14/2024   CREATININE 1.13 05/14/2024   BUN 21 05/14/2024   CO2 32 05/14/2024   TSH 3.13 05/14/2024   PSA 7.09 (H) 05/14/2024   HGBA1C 5.1 05/14/2024    No results found.  Assessment & Plan:   Problem List Items Addressed This Visit     Dyslipidemia   Mild  Reduce carbs Crestor  low dose Getting [employer] Semaglutide ijections      Relevant Orders   CBC with Differential/Platelet (Completed)   Comprehensive metabolic panel with GFR (Completed)   Hemoglobin A1c (Completed)   Lipid panel (Completed)   TSH (Completed)   Elevated BP without diagnosis of hypertension   BP Readings from Last 3 Encounters:  05/14/24 118/82  11/28/23 (!) 139/97  11/08/23 110/78         Elevated PSA   S/p bx x2 (-) MRI (-)       Relevant Orders   PSA (Completed)   HTN (hypertension) - Primary   Propranolol  10 mg prn      Palpitations   Start Propranolol  LA daily Propranolol  10 mg prn      Relevant Orders   CBC with Differential/Platelet (Completed)   Comprehensive metabolic panel with  GFR (Completed)   Hemoglobin A1c (Completed)   PSA (Completed)   Lipid panel (Completed)   TSH (Completed)      No orders of the defined types were placed in this encounter.     Follow-up: Return  in about 6 months (around 11/11/2024) for Wellness Exam.  Marolyn Noel, MD

## 2024-05-14 NOTE — Assessment & Plan Note (Signed)
Propranolol 10 mg prn

## 2024-05-14 NOTE — Assessment & Plan Note (Addendum)
 Mild  Reduce carbs Crestor  low dose Getting [facility] ijections

## 2024-05-14 NOTE — Assessment & Plan Note (Signed)
Start Propranolol LA daily ?Propranolol 10 mg prn ?

## 2024-05-15 LAB — LIPID PANEL
Cholesterol: 102 mg/dL (ref 0–200)
HDL: 37.3 mg/dL — ABNORMAL LOW (ref >39.00)
LDL Cholesterol: 47 mg/dL (ref 0–99)
NonHDL: 64.81
Total CHOL/HDL Ratio: 3
Triglycerides: 91 mg/dL (ref 0.0–149.0)
VLDL: 18.2 mg/dL (ref 0.0–40.0)

## 2024-05-15 LAB — CBC WITH DIFFERENTIAL/PLATELET
Basophils Absolute: 0.1 K/uL (ref 0.0–0.1)
Basophils Relative: 1.1 % (ref 0.0–3.0)
Eosinophils Absolute: 0.2 K/uL (ref 0.0–0.7)
Eosinophils Relative: 3.2 % (ref 0.0–5.0)
HCT: 47.8 % (ref 39.0–52.0)
Hemoglobin: 16.3 g/dL (ref 13.0–17.0)
Lymphocytes Relative: 32.1 % (ref 12.0–46.0)
Lymphs Abs: 2 K/uL (ref 0.7–4.0)
MCHC: 34 g/dL (ref 30.0–36.0)
MCV: 86.7 fl (ref 78.0–100.0)
Monocytes Absolute: 0.6 K/uL (ref 0.1–1.0)
Monocytes Relative: 9 % (ref 3.0–12.0)
Neutro Abs: 3.4 K/uL (ref 1.4–7.7)
Neutrophils Relative %: 54.6 % (ref 43.0–77.0)
Platelets: 184 K/uL (ref 150.0–400.0)
RBC: 5.51 Mil/uL (ref 4.22–5.81)
RDW: 13.9 % (ref 11.5–15.5)
WBC: 6.2 K/uL (ref 4.0–10.5)

## 2024-05-15 LAB — COMPREHENSIVE METABOLIC PANEL WITH GFR
ALT: 16 U/L (ref 0–53)
AST: 19 U/L (ref 0–37)
Albumin: 4.3 g/dL (ref 3.5–5.2)
Alkaline Phosphatase: 78 U/L (ref 39–117)
BUN: 21 mg/dL (ref 6–23)
CO2: 32 meq/L (ref 19–32)
Calcium: 9.8 mg/dL (ref 8.4–10.5)
Chloride: 104 meq/L (ref 96–112)
Creatinine, Ser: 1.13 mg/dL (ref 0.40–1.50)
GFR: 67.84 mL/min (ref >60.00)
Glucose, Bld: 82 mg/dL (ref 70–99)
Potassium: 4 meq/L (ref 3.5–5.1)
Sodium: 142 meq/L (ref 135–145)
Total Bilirubin: 0.9 mg/dL (ref 0.2–1.2)
Total Protein: 7.1 g/dL (ref 6.0–8.3)

## 2024-05-15 LAB — HEMOGLOBIN A1C: Hgb A1c MFr Bld: 5.1 % (ref 4.6–6.5)

## 2024-05-16 LAB — PSA: PSA: 7.09 ng/mL — ABNORMAL HIGH (ref 0.10–4.00)

## 2024-05-16 LAB — TSH: TSH: 3.13 u[IU]/mL (ref 0.35–5.50)

## 2024-05-17 ENCOUNTER — Ambulatory Visit: Payer: Self-pay | Admitting: Internal Medicine

## 2024-11-12 ENCOUNTER — Ambulatory Visit: Admitting: Internal Medicine
